# Patient Record
Sex: Female | Born: 1960 | Race: Black or African American | Hispanic: No | Smoking: Never smoker
Health system: Southern US, Community
[De-identification: ages and names within clinical notes are randomized; demographics above are authoritative.]

## PROBLEM LIST (undated history)

## (undated) DIAGNOSIS — I10 Essential (primary) hypertension: Secondary | ICD-10-CM

## (undated) DIAGNOSIS — E669 Obesity, unspecified: Secondary | ICD-10-CM

## (undated) HISTORY — DX: Essential (primary) hypertension: I10

## (undated) HISTORY — PX: TUBAL LIGATION: SHX77

---

## 1998-12-09 ENCOUNTER — Other Ambulatory Visit: Admission: RE | Admit: 1998-12-09 | Discharge: 1998-12-09 | Payer: Self-pay | Admitting: Internal Medicine

## 1999-01-02 ENCOUNTER — Encounter: Payer: Self-pay | Admitting: Internal Medicine

## 1999-01-02 ENCOUNTER — Ambulatory Visit (HOSPITAL_COMMUNITY): Admission: RE | Admit: 1999-01-02 | Discharge: 1999-01-02 | Payer: Self-pay | Admitting: Internal Medicine

## 1999-12-08 ENCOUNTER — Other Ambulatory Visit: Admission: RE | Admit: 1999-12-08 | Discharge: 1999-12-08 | Payer: Self-pay | Admitting: Internal Medicine

## 2000-03-04 ENCOUNTER — Ambulatory Visit (HOSPITAL_COMMUNITY): Admission: RE | Admit: 2000-03-04 | Discharge: 2000-03-04 | Payer: Self-pay | Admitting: Obstetrics and Gynecology

## 2000-03-04 ENCOUNTER — Encounter: Payer: Self-pay | Admitting: Obstetrics and Gynecology

## 2000-04-04 ENCOUNTER — Encounter: Payer: Self-pay | Admitting: Obstetrics and Gynecology

## 2000-04-04 ENCOUNTER — Ambulatory Visit (HOSPITAL_COMMUNITY): Admission: RE | Admit: 2000-04-04 | Discharge: 2000-04-04 | Payer: Self-pay | Admitting: Obstetrics and Gynecology

## 2000-08-15 ENCOUNTER — Inpatient Hospital Stay (HOSPITAL_COMMUNITY): Admission: AD | Admit: 2000-08-15 | Discharge: 2000-08-15 | Payer: Self-pay | Admitting: Obstetrics and Gynecology

## 2000-08-16 ENCOUNTER — Inpatient Hospital Stay (HOSPITAL_COMMUNITY): Admission: AD | Admit: 2000-08-16 | Discharge: 2000-08-19 | Payer: Self-pay | Admitting: Obstetrics and Gynecology

## 2000-08-16 ENCOUNTER — Encounter (INDEPENDENT_AMBULATORY_CARE_PROVIDER_SITE_OTHER): Payer: Self-pay | Admitting: *Deleted

## 2000-11-29 ENCOUNTER — Other Ambulatory Visit: Admission: RE | Admit: 2000-11-29 | Discharge: 2000-11-29 | Payer: Self-pay | Admitting: Obstetrics and Gynecology

## 2000-12-09 ENCOUNTER — Ambulatory Visit (HOSPITAL_COMMUNITY): Admission: RE | Admit: 2000-12-09 | Discharge: 2000-12-09 | Payer: Self-pay | Admitting: Obstetrics and Gynecology

## 2000-12-09 ENCOUNTER — Encounter: Payer: Self-pay | Admitting: Obstetrics and Gynecology

## 2001-03-01 ENCOUNTER — Ambulatory Visit (HOSPITAL_COMMUNITY): Admission: RE | Admit: 2001-03-01 | Discharge: 2001-03-01 | Payer: Self-pay | Admitting: Obstetrics and Gynecology

## 2001-03-01 ENCOUNTER — Encounter: Payer: Self-pay | Admitting: Obstetrics and Gynecology

## 2001-12-11 ENCOUNTER — Encounter: Payer: Self-pay | Admitting: Obstetrics and Gynecology

## 2001-12-11 ENCOUNTER — Ambulatory Visit (HOSPITAL_COMMUNITY): Admission: RE | Admit: 2001-12-11 | Discharge: 2001-12-11 | Payer: Self-pay | Admitting: Obstetrics and Gynecology

## 2002-11-14 ENCOUNTER — Other Ambulatory Visit: Admission: RE | Admit: 2002-11-14 | Discharge: 2002-11-14 | Payer: Self-pay | Admitting: Obstetrics and Gynecology

## 2004-06-10 ENCOUNTER — Other Ambulatory Visit: Admission: RE | Admit: 2004-06-10 | Discharge: 2004-06-10 | Payer: Self-pay | Admitting: Obstetrics and Gynecology

## 2004-06-17 ENCOUNTER — Ambulatory Visit (HOSPITAL_COMMUNITY): Admission: RE | Admit: 2004-06-17 | Discharge: 2004-06-17 | Payer: Self-pay | Admitting: Obstetrics and Gynecology

## 2005-07-27 ENCOUNTER — Ambulatory Visit (HOSPITAL_COMMUNITY): Admission: RE | Admit: 2005-07-27 | Discharge: 2005-07-27 | Payer: Self-pay | Admitting: Internal Medicine

## 2006-08-18 ENCOUNTER — Ambulatory Visit (HOSPITAL_COMMUNITY): Admission: RE | Admit: 2006-08-18 | Discharge: 2006-08-18 | Payer: Self-pay | Admitting: Internal Medicine

## 2007-11-20 ENCOUNTER — Ambulatory Visit (HOSPITAL_COMMUNITY): Admission: RE | Admit: 2007-11-20 | Discharge: 2007-11-20 | Payer: Self-pay | Admitting: Internal Medicine

## 2008-12-11 ENCOUNTER — Ambulatory Visit (HOSPITAL_COMMUNITY): Admission: RE | Admit: 2008-12-11 | Discharge: 2008-12-11 | Payer: Self-pay | Admitting: Internal Medicine

## 2009-09-24 ENCOUNTER — Emergency Department (HOSPITAL_COMMUNITY): Admission: EM | Admit: 2009-09-24 | Discharge: 2009-09-24 | Payer: Self-pay | Admitting: Emergency Medicine

## 2010-06-12 NOTE — Discharge Summary (Signed)
[facility]  Patient:    Danielle Rogers, Danielle Rogers                        MRN: [id number] Adm. Date:  [id number] Disc. Date: [id number] Attending:  Shaune Spittle Dictator:   Vance Gather Duplantis, C.N.M.                           Discharge Summary  NO DICTATION DD:  08/19/00 TD:  08/19/00 Job: [id number]

## 2010-06-12 NOTE — Op Note (Signed)
[facility]  Patient:    Danielle Rogers, Danielle Rogers                        MRN: [id number] Proc. Date: 08/16/00 Adm. Date:  [id number] Attending:  Dierdre Forth [place]                           Operative Report  PREOPERATIVE DIAGNOSES:       1. Intrauterine pregnancy at [redacted] weeks gestation.                               2. Meconium stained amniotic fluid.                               3. Failure to progress in labor.                               4. Nonreassuring fetal heart rate tracing.                               5. Desire for surgical sterilization.  POSTOPERATIVE DIAGNOSES:      1. Intrauterine pregnancy at [redacted] weeks gestation.                               2. Meconium stained amniotic fluid.                               3. Failure to progress in labor.                               4. Nonreassuring fetal heart rate tracing.                               5. Desire for surgical sterilization.                               6. Nuchal and body cord.  OPERATIONS:                   1. Primary low transverse cesarean section.                               2. Bilateral tubal sterilization.  SURGEON:                      Vanessa P. Pennie Rushing, M.D.  ANESTHESIA:                   Epidural.  ESTIMATED BLOOD LOSS:         Less than 750 cc.  COMPLICATIONS:                None.  FINDINGS:                     The patient was delivered of a female infant weighing 7  lb 5 oz with Apgars of 8 and 9 at one and five minutes respectively.  The uterus, tubes and ovaries were normal for the gravid state.   DESCRIPTION OF PROCEDURE:     The patient was taken to the operating room after appropriate identification and placed on the operating table.  After attaining surgical anesthesia with her labor epidural and with her labor Foley in place, the abdomen was prepped with multiple layers of Betadine and draped as a sterile field with the patient in the supine position with a  left lateral tilt.  A transverse incision was made in the abdomen and the abdomen opened in layers.  The peritoneum was entered.  The uterus was then incised 1 cm above the uterovesical fold and the infant delivered from the occiput posterior position.  After having the nares and pharynx DeLee suctioned and the nuchal and body cords reduced, the cord was clamped and cut and the infant was handed off to the awaiting pediatricians.  Appropriate cord blood was drawn.  The placenta was noted to separate from the uterus and was removed from the operative field.  The uterine incision was then closed with a running interlocking suture of 0 Vicryl.  An imbricating suture of 0 Vicryl was placed.  Hemostasis was noted to be adequate.  The left fallopian tube was then identified, followed to its fimbriated end and grasped at the isthmic portion.  A suture of 2-0 chromic was placed through the mesosalpinx and tied fore and aft on a knuckle of tube.  A second ligature was placed proximal to that and the intervening knuckle of tube excised.  The cut ends were cauterized.  A similar procedure was carried out on the right side.  The visceral peritoneum was the uterus was repaired with a figure-of-eight suture of 2-0 Vicryl.  Copious irrigation was carried out and the abdominal peritoneum closed with a running suture of 2-0 Vicryl once hemostasis was noted to be adequate.  The rectus muscles were reapproximated with a figure-of-eight suture of 2-0 Vicryl.  The rectus muscles were irrigated and made hemostatic with Bovie cautery.  The rectus fascia was closed with a running suture of 0 Vicryl then reinforced on either side of midline with a figure-of-eight suture of 0 Vicryl.  The subcutaneous tissue was irrigated and made hemostatic with Bovie cautery.  Skin staples were applied to the skin incision.  A sterile dressing was applied.  The patient was taken from the operating room to the recovery room in  satisfactory condition, having tolerated the procedure well, with sponge and instrument counts correct. DD:  08/16/00 TD:  08/17/00 Job: [id number]

## 2010-06-12 NOTE — Discharge Summary (Signed)
[facility]  Patient:    Danielle Rogers, Danielle Rogers Visit Number: [id number] MRN: [id number]          Service Type: OBS Location: 910A [id number] [id number] Attending Physician:  Shaune Spittle Dictated by:   Vance Gather Duplantis, C.N.M. Adm. Date:  08/16/2000 Disc. Date: 08/19/2000                             Discharge Summary  ADMISSION DIAGNOSES:          1. Intrauterine pregnancy at 41-1/7 weeks.                               2. Early labor.                               3. Positive group B strep.                               4. Multiparity.                               5. Desiring bilateral tubal ligation for                                  sterilization.  DISCHARGE DIAGNOSES:          1. Intrauterine pregnancy at 41-1/7 weeks.                               2. Early labor.                               3. Positive group B strep.                               4. Multiparity.                               5. Desiring bilateral tubal ligation for                                  sterilization.                               6. Nonreassuring fetal heart rate tracing.                               7. Meconium-stained fluid.                               8. Failure to progress in labor.                               9. Status post primary low transverse cesarean  section and bilateral tubal ligation.  PROCEDURES THIS ADMISSION:    1. Primary low transverse cesarean section for                                  delivery of a viable female infant named                                  Danielle Rogers who weighed 7 pounds 5 ounces and had                                  Apgars of 8 and 9 on August 16, 2000 by                                  Dr. Dierdre Forth.                               2. She also had a bilateral tubal ligation done                                  with her cesarean section.  HOSPITAL COURSE:              [name]  is a 50 year old, married black female, gravida 2, para 1-0-0-1, at 41-1/7 weeks who was admitted in early active labor. She progressed to 4-5 cm, 90% and -2 with minimally adequate labor. However, the fetal heart rate started showing intermittent decelerations and did not improve with amnioinfusion and the fetal heart rate was not able to tolerate Pitocin to augment her labor. In light of the circumstances, she was recommended to proceed with a cesarean section which she agreed to do and underwent the same, delivering a viable female infant named Danielle Rogers who weighed 7 pounds 5 ounces and had Apgars of 8 and 9 on July 23,2002 attended by Dr. Dierdre Forth. She also had a bilateral tubal ligation at time of C-section without complication. Please see operative note for details.  Postoperatively, [name] has done well. She is ambulating, voiding, and eating without difficulty. Her vital signs are stable and she has been afebrile throughout her hospital course. She is breast-feeding, also without difficulty. She is deemed ready for discharge August 19, 2000.  DISCHARGE INSTRUCTIONS:       Instructions are as per the [facility] handout.  DISCHARGE MEDICATIONS:        1. Motrin 600 mg p.o. q.6h. p.r.n. for pain.                               2. Tylox one to two p.o. q.4-6h. p.r.n. for                                  pain.                               3. Prenatal vitamins daily.  DISCHARGE LABORATORIES:  Her hemoglobin is 10.0, it was 12.0. Her WBC count was 8.3 and her platelets are 160,000.  DISCHARGE FOLLOWUP:           The patient is to follow up at [facility] in six weeks or p.r.n. Dictated by:   Vance Gather Duplantis, C.N.M.  Attending Physician:  Shaune Spittle DD:  09/15/00 TD:  09/15/00 Job: [id number]

## 2011-03-26 ENCOUNTER — Encounter (HOSPITAL_COMMUNITY): Payer: Self-pay | Admitting: Emergency Medicine

## 2011-03-26 ENCOUNTER — Emergency Department (HOSPITAL_COMMUNITY)
Admission: EM | Admit: 2011-03-26 | Discharge: 2011-03-26 | Disposition: A | Discharge status: Home Health | Payer: Self-pay | Source: Home / Self Care | Attending: Emergency Medicine | Admitting: Emergency Medicine

## 2011-03-26 DIAGNOSIS — M25561 Pain in right knee: Secondary | ICD-10-CM

## 2011-03-26 DIAGNOSIS — M25569 Pain in unspecified knee: Secondary | ICD-10-CM

## 2011-03-26 HISTORY — DX: Obesity, unspecified: E66.9

## 2011-03-26 MED ORDER — HYDROCODONE-ACETAMINOPHEN 5-325 MG PO TABS: 2.0000 | ORAL_TABLET | ORAL | Status: AC | PRN

## 2011-03-26 MED ORDER — NAPROXEN 500 MG PO TABS: 500.0000 mg | ORAL_TABLET | Freq: Two times a day (BID) | ORAL | Status: DC

## 2011-03-26 NOTE — ED Provider Notes (Signed)
History     CSN: [id number]  Arrival date & time 03/26/11  7829   First MD Initiated Contact with Patient 03/26/11 [phone]      Chief Complaint  Patient presents with  . Leg Pain  . Headache    (Consider location/radiation/quality/duration/timing/severity/associated sxs/prior treatment) HPI Comments: Patient reports achy, nonradiating right knee pain located "behind my knee cap" starting last week. Patient states pain is worse with sitting for prolonged periods of time, going up and down hills. No crepitus, redness, swelling, effusion, bruising, distal weakness or paresthesias. No nausea, vomiting, fevers. No recent or remote history of injury to this knee. No history of gout.   ROS as noted in HPI. All other ROS negative.   Patient is a 51 y.o. female presenting with leg pain. The history is provided by the patient. No language interpreter was used.  Leg Pain  The incident occurred more than 1 week ago. There was no injury mechanism. The pain is present in the right knee. The pain has been intermittent since onset. Pertinent negatives include no numbness, no inability to bear weight, no loss of motion, no muscle weakness and no tingling. The symptoms are aggravated by activity. She has tried nothing for the symptoms. The treatment provided no relief.    Past Medical History  Diagnosis Date  . Obesity     Past Surgical History  Procedure Date  . Tubal ligation     History reviewed. No pertinent family history.  History  Substance Use Topics  . Smoking status: Never Smoker   . Smokeless tobacco: Not on file  . Alcohol Use: No    OB History    Grav Para Term Preterm Abortions TAB SAB Ect Mult Living                  Review of Systems  Neurological: Negative for tingling and numbness.    Allergies  Review of patient's allergies indicates no known allergies.  Home Medications   Current Outpatient Rx  Name Route Sig Dispense Refill  . HYDROCODONE-ACETAMINOPHEN  5-325 MG PO TABS Oral Take 2 tablets by mouth every 4 (four) hours as needed for pain. 20 tablet 0  . NAPROXEN 500 MG PO TABS Oral Take 1 tablet (500 mg total) by mouth 2 (two) times daily. 20 tablet 0    BP 123/81  Pulse 66  Temp(Src) 97 F (36.1 C) (Oral)  Resp 17  SpO2 100%  LMP 03/22/2011  Physical Exam  Nursing note and vitals reviewed. Constitutional: She is oriented to person, place, and time. She appears well-developed and well-nourished. No distress.  HENT:  Head: Normocephalic and atraumatic.  Eyes: Conjunctivae and EOM are normal.  Neck: Normal range of motion.  Cardiovascular: Normal rate.   Pulmonary/Chest: Effort normal.  Abdominal: She exhibits no distension.  Musculoskeletal: Normal range of motion.       Mild crepitus. No effusion, redness, increased temperature compared to the knee. Knee ROM baseline for PT, Flexion/extension  Intact, Patella NT, Patellar apprehension test negative, Patellar tendon NT, Medial joint NT, Lateral joint NT, Popliteal region NT, Lachman's stable, Varus stress testing stable, Valgus stress testing stable, McMurray's testing normal , distal NVI with intact baseline sensation / motor / pulse distal to knee. Distal leg, Ankle, foot within normal limits  Neurological: She is alert and oriented to person, place, and time.  Skin: Skin is warm and dry.  Psychiatric: She has a normal mood and affect. Her behavior is normal. Judgment and thought  content normal.    ED Course  Procedures (including critical care time)  Labs Reviewed - No data to display No results found.   1. Right anterior knee pain      MDM  H&P most consistent with patellofemoral syndrome. No knee effusion, no recent or remote history of trauma, knee exam within normal limits. Deferring imaging. Sent home with NSAIDs, relative rest, ice, knee sleeve. Will have her followup with the [facility] if no improvement with this.   Luiz Blare,  MD 03/26/11 [phone]

## 2011-03-26 NOTE — Discharge Instructions (Signed)
RESOURCE GUIDE  Chronic Pain Problems Contact [facility]  [phone] Patients need to be referred by their primary care doctor.  Insufficient Money for Medicine Contact [employer]:  call "211" or [facility] [phone].  No Primary Care Doctor Call [facility]  [phone] Other agencies that provide inexpensive medical care    [facility]  [phone]    [facility]  [phone]    [facility]  [phone]    [facility]  [phone] [address] [place] [address]    [facility]  [phone]    [facility]  [phone] [facility] [phone]   [address] [place] [address] [place], [place] [address]  [facility] Resources  [facility]     [employer]                          [facility] Dept. [address] [place]                       [address]      [address] [place] [address]   [phone] (After Hours)

## 2011-03-26 NOTE — ED Notes (Signed)
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

## 2011-10-31 ENCOUNTER — Emergency Department (HOSPITAL_COMMUNITY)
Admission: EM | Admit: 2011-10-31 | Discharge: 2011-10-31 | Disposition: A | Discharge status: Home / Self Care | Payer: Self-pay | Source: Home / Self Care | Attending: Emergency Medicine | Admitting: Emergency Medicine

## 2011-10-31 ENCOUNTER — Encounter (HOSPITAL_COMMUNITY): Payer: Self-pay

## 2011-10-31 ENCOUNTER — Emergency Department (INDEPENDENT_AMBULATORY_CARE_PROVIDER_SITE_OTHER): Payer: Self-pay

## 2011-10-31 DIAGNOSIS — M25461 Effusion, right knee: Secondary | ICD-10-CM

## 2011-10-31 DIAGNOSIS — M1710 Unilateral primary osteoarthritis, unspecified knee: Secondary | ICD-10-CM

## 2011-10-31 DIAGNOSIS — M25469 Effusion, unspecified knee: Secondary | ICD-10-CM

## 2011-10-31 MED ORDER — IBUPROFEN 800 MG PO TABS: 800.0000 mg | ORAL_TABLET | Freq: Three times a day (TID) | ORAL | Status: DC

## 2011-10-31 MED ORDER — HYDROCODONE-ACETAMINOPHEN 5-500 MG PO TABS: 1.0000 | ORAL_TABLET | Freq: Four times a day (QID) | ORAL | Status: DC | PRN

## 2011-10-31 NOTE — ED Notes (Signed)
Pain states that she feell approx 2 weeks ago and legs have continued to swell , pain is stabbing and throbbing

## 2011-10-31 NOTE — ED Provider Notes (Signed)
History     CSN: [id number]  Arrival date & time 10/31/11  0902   First MD Initiated Contact with Patient 10/31/11 [phone]      Chief Complaint  Patient presents with  . Leg Swelling    (Consider location/radiation/quality/duration/timing/severity/associated sxs/prior treatment) HPI Comments: Patient presents urgent care complaining of right knee pain that exacerbates with walking and and simply putting weight on her right knee. She states that about 2 weeks ago she fell landing in her left knee, interestingly her left knee is not hurting but she does express that she's been hurting from her right knee as she believes she twisted her right knee during the fall. Pain is mainly on the medial aspect of her right knee it feels like a stabbing and throbbing sensation that occasionally wakes her up at night. He has been taking some Aleve over-the-counter with some partial relief but the pain has been getting worse within the last few days. Patient denies any symptoms such as popping sounds or her knee giving out denies any fever chills, but does describe that her knee looks swollen. Patient denies any numbness or tingling or distal weakness of the affected knee.  The history is provided by the patient.    Past Medical History  Diagnosis Date  . Obesity     Past Surgical History  Procedure Date  . Tubal ligation     No family history on file.  History  Substance Use Topics  . Smoking status: Never Smoker   . Smokeless tobacco: Not on file  . Alcohol Use: No    OB History    Grav Para Term Preterm Abortions TAB SAB Ect Mult Living                  Review of Systems  Constitutional: Positive for activity change. Negative for fever, chills, appetite change and fatigue.  Musculoskeletal: Positive for joint swelling.  Skin: Negative for color change, rash and wound.  Neurological: Negative for weakness and numbness.    Allergies  Review of patient's allergies indicates no known  allergies.  Home Medications   Current Outpatient Rx  Name Route Sig Dispense Refill  . HYDROCODONE-ACETAMINOPHEN 5-500 MG PO TABS Oral Take 1 tablet by mouth every 6 (six) hours as needed for pain. 15 tablet 0  . IBUPROFEN 800 MG PO TABS Oral Take 1 tablet (800 mg total) by mouth 3 (three) times daily. 21 tablet 0    BP 119/78  Pulse 80  Temp 99.3 F (37.4 C) (Oral)  Resp 18  SpO2 98%  LMP 08/31/2011  Physical Exam  Nursing note and vitals reviewed. Constitutional: No distress.  Eyes: Conjunctivae normal are normal.  Neck: Neck supple.  Musculoskeletal: She exhibits tenderness.       Right knee: She exhibits decreased range of motion and swelling. She exhibits no ecchymosis, no deformity, no laceration, no erythema, normal alignment, no LCL laxity, normal patellar mobility, no bony tenderness, normal meniscus and no MCL laxity. tenderness found. Medial joint line and MCL tenderness noted. No lateral joint line, no LCL and no patellar tendon tenderness noted.       Legs: Neurological: She is alert.  Skin: No rash noted. She is not diaphoretic. No erythema.    ED Course  Procedures (including critical care time)  Labs Reviewed - No data to display Dg Knee Complete 4 Views Right  10/31/2011  *RADIOLOGY REPORT*  Clinical Data: Right knee pain and soft tissue swelling.  RIGHT KNEE -  COMPLETE 4+ VIEW  Comparison: None.  Findings: No acute fracture or dislocation.  There is mild medial joint space narrowing consistent with osteoarthritis.  Lateral view shows probable suprapatellar joint fluid.  IMPRESSION: No acute fracture.  Probable suprapatellar joint effusion with evidence of osteoarthritis.   Original Report Authenticated By: Reola Calkins, M.D.      1. Arthritis of knee, degenerative   2. Knee effusion, right       MDM  Patient with moderate to severe arthritis and a recent knee injury. Meat felt stable with no increased laxity with meniscal maneuvers. I do suspect  patient might have a medial meniscus or medial collateral ligament sprain strain perhaps a minor tear. Patient also has a mild suprapatellar effusion. I have recommended patient to followup with an orthopedic provider referral was given to her. Have encouraged her to take Motrin every 6-8 hours with fluids and to take Lortab for breakthrough pain management also encouraged her to use the Ace wrap for mild compression. Patient agree with treatment plan and will followup with the orthopedic provider as discussed, for further evaluation and treatment.        Jimmie Molly, MD 10/31/11 1039

## 2011-10-31 NOTE — ED Notes (Signed)
Pt refused knee sleve - given ace wrap instead per request

## 2013-01-10 ENCOUNTER — Encounter (HOSPITAL_COMMUNITY): Payer: Self-pay | Admitting: Emergency Medicine

## 2013-01-10 ENCOUNTER — Emergency Department (INDEPENDENT_AMBULATORY_CARE_PROVIDER_SITE_OTHER): Payer: Self-pay

## 2013-01-10 ENCOUNTER — Emergency Department (INDEPENDENT_AMBULATORY_CARE_PROVIDER_SITE_OTHER)
Admission: EM | Admit: 2013-01-10 | Discharge: 2013-01-10 | Disposition: A | Discharge status: Home / Self Care | Payer: Self-pay | Source: Home / Self Care | Attending: Family Medicine | Admitting: Family Medicine

## 2013-01-10 DIAGNOSIS — M25569 Pain in unspecified knee: Secondary | ICD-10-CM

## 2013-01-10 DIAGNOSIS — M25562 Pain in left knee: Secondary | ICD-10-CM

## 2013-01-10 MED ORDER — HYDROCODONE-ACETAMINOPHEN 5-325 MG PO TABS: 2.0000 | ORAL_TABLET | ORAL | Status: DC | PRN

## 2013-01-10 MED ORDER — MELOXICAM 7.5 MG PO TABS: 7.5000 mg | ORAL_TABLET | Freq: Every day | ORAL | Status: DC

## 2013-01-10 NOTE — ED Notes (Signed)
Reportedly fell 1 year ago, and injured her knee. States she began to have pain again in the same knee last night, no new injury

## 2013-01-10 NOTE — ED Provider Notes (Signed)
CSN: [id number]     Arrival date & time 01/10/13  1556 History   First MD Initiated Contact with Patient 01/10/13 1722     Chief Complaint  Patient presents with  . Knee Pain   (Consider location/radiation/quality/duration/timing/severity/associated sxs/prior Treatment) Patient is a 52 y.o. female presenting with knee pain. The history is provided by the patient. No language interpreter was used.  Knee Pain Location:  Knee Injury: no   Knee location:  L knee Pain details:    Quality:  Aching   Radiates to:  Does not radiate   Severity:  Moderate   Timing:  Constant Chronicity:  New Dislocation: no   Foreign body present:  No foreign bodies Prior injury to area:  Yes Relieved by:  Nothing Worsened by:  Nothing tried   Past Medical History  Diagnosis Date  . Obesity    Past Surgical History  Procedure Laterality Date  . Tubal ligation     History reviewed. No pertinent family history. History  Substance Use Topics  . Smoking status: Never Smoker   . Smokeless tobacco: Not on file  . Alcohol Use: No   OB History   Grav Para Term Preterm Abortions TAB SAB Ect Mult Living                 Review of Systems  Musculoskeletal: Positive for joint swelling.  All other systems reviewed and are negative.    Allergies  Review of patient's allergies indicates no known allergies.  Home Medications   Current Outpatient Rx  Name  Route  Sig  Dispense  Refill  . hydrochlorothiazide (HYDRODIURIL) 12.5 MG tablet   Oral   Take 12.5 mg by mouth daily.         Marland Kitchen HYDROcodone-acetaminophen (LORTAB 5) 5-500 MG per tablet   Oral   Take 1 tablet by mouth every 6 (six) hours as needed for pain.   15 tablet   0   . ibuprofen (ADVIL,MOTRIN) 800 MG tablet   Oral   Take 1 tablet (800 mg total) by mouth 3 (three) times daily.   21 tablet   0    BP 138/72  Pulse 94  Temp(Src) 98.1 F (36.7 C) (Oral)  Resp 16  SpO2 100% Physical Exam  Nursing note and vitals  reviewed. Constitutional: She appears well-developed and well-nourished.  Musculoskeletal: Normal range of motion. She exhibits tenderness.  Swollen left knee,  Tender posterior knee,  From  nv and ns intact  Neurological: She has normal reflexes.  Skin: Skin is warm.  Psychiatric: She has a normal mood and affect.    ED Course  Procedures (including critical care time) Labs Review Labs Reviewed - No data to display Imaging Review Dg Knee Complete 4 Views Left  01/10/2013   CLINICAL DATA:  Pain.  EXAM: LEFT KNEE - COMPLETE 4+ VIEW  COMPARISON:  None.  FINDINGS: There is no evidence of fracture, dislocation, or joint effusion. There is no evidence of arthropathy or other focal bone abnormality. Soft tissues are unremarkable.  IMPRESSION: Negative.   Electronically Signed   By: Maisie Fus  Register   On: 01/10/2013 18:16    EKG Interpretation    Date/Time:    Ventricular Rate:    PR Interval:    QRS Duration:   QT Interval:    QTC Calculation:   R Axis:     Text Interpretation:              MDM  No diagnosis found.  I advised pt I think she needs to seean Orthopaedist for evaluation.   I reviewed xray question of bakers cyst. Pt given rx for meloxicam and hydrocodone.     Lonia Skinner [place], PA-C 01/10/13 [phone]

## 2013-01-11 NOTE — ED Provider Notes (Signed)
Medical screening examination/treatment/procedure(s) were performed by resident physician or non-physician practitioner and as supervising physician I was immediately available for consultation/collaboration.   Abe Schools DOUGLAS MD.   Mckensi Redinger D Amrita Radu, MD 01/11/13 1739 

## 2013-08-09 ENCOUNTER — Other Ambulatory Visit: Payer: Self-pay | Admitting: Obstetrics and Gynecology

## 2013-08-09 DIAGNOSIS — Z1231 Encounter for screening mammogram for malignant neoplasm of breast: Secondary | ICD-10-CM

## 2013-09-18 ENCOUNTER — Ambulatory Visit (HOSPITAL_COMMUNITY): Payer: Self-pay | Attending: Obstetrics and Gynecology

## 2013-09-18 ENCOUNTER — Ambulatory Visit (HOSPITAL_COMMUNITY): Payer: Self-pay

## 2014-03-18 ENCOUNTER — Other Ambulatory Visit: Payer: Self-pay

## 2014-03-18 DIAGNOSIS — Z1231 Encounter for screening mammogram for malignant neoplasm of breast: Secondary | ICD-10-CM

## 2014-03-29 ENCOUNTER — Ambulatory Visit
Admission: RE | Admit: 2014-03-29 | Discharge: 2014-03-29 | Disposition: A | Discharge status: Home / Self Care | Payer: BLUE CROSS/BLUE SHIELD | Source: Ambulatory Visit

## 2014-03-29 DIAGNOSIS — Z1231 Encounter for screening mammogram for malignant neoplasm of breast: Secondary | ICD-10-CM

## 2014-05-03 LAB — HM COLONOSCOPY

## 2015-12-05 ENCOUNTER — Encounter (HOSPITAL_COMMUNITY): Payer: Self-pay | Admitting: Emergency Medicine

## 2015-12-05 ENCOUNTER — Emergency Department (HOSPITAL_COMMUNITY)
Admission: EM | Admit: 2015-12-05 | Discharge: 2015-12-05 | Disposition: A | Discharge status: Home / Self Care | Payer: No Typology Code available for payment source | Attending: Emergency Medicine | Admitting: Emergency Medicine

## 2015-12-05 DIAGNOSIS — S46812A Strain of other muscles, fascia and tendons at shoulder and upper arm level, left arm, initial encounter: Secondary | ICD-10-CM | POA: Diagnosis not present

## 2015-12-05 DIAGNOSIS — M62838 Other muscle spasm: Secondary | ICD-10-CM

## 2015-12-05 DIAGNOSIS — I10 Essential (primary) hypertension: Secondary | ICD-10-CM | POA: Diagnosis not present

## 2015-12-05 DIAGNOSIS — Y92410 Unspecified street and highway as the place of occurrence of the external cause: Secondary | ICD-10-CM | POA: Diagnosis not present

## 2015-12-05 DIAGNOSIS — Y999 Unspecified external cause status: Secondary | ICD-10-CM | POA: Insufficient documentation

## 2015-12-05 DIAGNOSIS — Z791 Long term (current) use of non-steroidal anti-inflammatories (NSAID): Secondary | ICD-10-CM | POA: Diagnosis not present

## 2015-12-05 DIAGNOSIS — S4992XA Unspecified injury of left shoulder and upper arm, initial encounter: Secondary | ICD-10-CM | POA: Diagnosis present

## 2015-12-05 DIAGNOSIS — Y9389 Activity, other specified: Secondary | ICD-10-CM | POA: Insufficient documentation

## 2015-12-05 MED ORDER — NAPROXEN 500 MG PO TABS: 500.0000 mg | ORAL_TABLET | Freq: Two times a day (BID) | ORAL | 0 refills | Status: DC | PRN

## 2015-12-05 MED ORDER — CYCLOBENZAPRINE HCL 10 MG PO TABS: 10.0000 mg | ORAL_TABLET | Freq: Three times a day (TID) | ORAL | 0 refills | Status: DC | PRN

## 2015-12-05 NOTE — Discharge Instructions (Signed)
Take naprosyn as directed for inflammation and pain with tylenol for breakthrough pain and flexeril for muscle relaxation. Do not drive or operate machinery with muscle relaxant use. Use heat to areas of soreness, no more than 20 minutes at a time every hour. Expect to be sore for the next few days and follow up with primary care physician for recheck of ongoing symptoms in the next 1-2 weeks. Return to ER for emergent changing or worsening of symptoms.  °  °
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
## 2016-10-29 ENCOUNTER — Encounter (HOSPITAL_COMMUNITY): Payer: Self-pay | Admitting: *Deleted

## 2016-10-29 ENCOUNTER — Emergency Department (HOSPITAL_COMMUNITY)
Admission: EM | Admit: 2016-10-29 | Discharge: 2016-10-29 | Disposition: A | Discharge status: Home / Self Care | Payer: No Typology Code available for payment source | Attending: Emergency Medicine | Admitting: Emergency Medicine

## 2016-10-29 ENCOUNTER — Emergency Department (HOSPITAL_COMMUNITY): Payer: No Typology Code available for payment source

## 2016-10-29 DIAGNOSIS — Y998 Other external cause status: Secondary | ICD-10-CM | POA: Insufficient documentation

## 2016-10-29 DIAGNOSIS — M25512 Pain in left shoulder: Secondary | ICD-10-CM | POA: Diagnosis present

## 2016-10-29 DIAGNOSIS — R079 Chest pain, unspecified: Secondary | ICD-10-CM | POA: Insufficient documentation

## 2016-10-29 DIAGNOSIS — Z79899 Other long term (current) drug therapy: Secondary | ICD-10-CM | POA: Insufficient documentation

## 2016-10-29 DIAGNOSIS — Y9389 Activity, other specified: Secondary | ICD-10-CM | POA: Diagnosis not present

## 2016-10-29 DIAGNOSIS — Y92410 Unspecified street and highway as the place of occurrence of the external cause: Secondary | ICD-10-CM | POA: Insufficient documentation

## 2016-10-29 MED ORDER — CYCLOBENZAPRINE HCL 10 MG PO TABS: 10.0000 mg | ORAL_TABLET | Freq: Three times a day (TID) | ORAL | 0 refills | Status: DC | PRN

## 2016-10-29 MED ORDER — NAPROXEN 500 MG PO TABS: 500.0000 mg | ORAL_TABLET | Freq: Two times a day (BID) | ORAL | 0 refills | Status: DC | PRN

## 2016-10-29 MED ORDER — ACETAMINOPHEN 500 MG PO TABS
500.0000 mg | ORAL_TABLET | Freq: Four times a day (QID) | ORAL | 0 refills | Status: AC | PRN
Start: 2016-10-29 — End: ?

## 2016-10-29 MED ORDER — IBUPROFEN 400 MG PO TABS
600.0000 mg | ORAL_TABLET | Freq: Once | ORAL | Status: AC | PRN
  Administered 2016-10-29: 600 mg via ORAL
  Filled 2016-10-29: qty 1
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
## 2017-03-16 ENCOUNTER — Other Ambulatory Visit: Payer: Self-pay | Admitting: Internal Medicine

## 2017-03-16 DIAGNOSIS — Z1231 Encounter for screening mammogram for malignant neoplasm of breast: Secondary | ICD-10-CM

## 2017-10-15 ENCOUNTER — Encounter: Payer: Self-pay | Admitting: Nurse Practitioner

## 2017-10-15 DIAGNOSIS — Z6841 Body Mass Index (BMI) 40.0 and over, adult: Secondary | ICD-10-CM

## 2017-10-28 ENCOUNTER — Ambulatory Visit: Payer: Self-pay | Admitting: Internal Medicine

## 2017-10-28 ENCOUNTER — Telehealth: Payer: Self-pay | Admitting: Internal Medicine
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
## 2018-03-02 DIAGNOSIS — J101 Influenza due to other identified influenza virus with other respiratory manifestations: Secondary | ICD-10-CM | POA: Diagnosis not present

## 2018-03-02 DIAGNOSIS — Z79899 Other long term (current) drug therapy: Secondary | ICD-10-CM | POA: Diagnosis not present

## 2018-03-02 DIAGNOSIS — R509 Fever, unspecified: Secondary | ICD-10-CM | POA: Diagnosis present

## 2018-03-03 ENCOUNTER — Emergency Department (HOSPITAL_COMMUNITY)
Admission: EM | Admit: 2018-03-03 | Discharge: 2018-03-03 | Disposition: A | Discharge status: Home / Self Care | Payer: No Typology Code available for payment source | Attending: Emergency Medicine | Admitting: Emergency Medicine

## 2018-03-03 ENCOUNTER — Other Ambulatory Visit: Payer: Self-pay

## 2018-03-03 DIAGNOSIS — J111 Influenza due to unidentified influenza virus with other respiratory manifestations: Secondary | ICD-10-CM

## 2018-03-03 DIAGNOSIS — R69 Illness, unspecified: Secondary | ICD-10-CM

## 2018-03-03 MED ORDER — BENZONATATE 100 MG PO CAPS: 200.0000 mg | ORAL_CAPSULE | Freq: Two times a day (BID) | ORAL | 0 refills | Status: DC | PRN

## 2018-03-03 MED ORDER — MECLIZINE HCL 25 MG PO TABS: 25.0000 mg | ORAL_TABLET | Freq: Two times a day (BID) | ORAL | 0 refills | Status: DC | PRN

## 2018-03-03 NOTE — ED Triage Notes (Signed)
Patient c/o cough, chills, sore throat, dizziness and diarrhea x3 days.

## 2018-03-03 NOTE — ED Provider Notes (Addendum)
[facility]   CSN: [id number] Arrival date & time: 03/02/18  2352     History   Chief Complaint Chief Complaint  Patient presents with  . URI    HPI Elson ClanRachel Rogers is a 58 y.o. female.  Patient presents to the emergency department with a chief complaint of fevers, chills, cough, sore throat, and some dizziness.  Reports onset of symptoms 3 days ago.  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  Also reports having a couple of episodes of diarrhea.  Has tried OTC medications with some relief.  Denies any other associated symptoms.  There are no aggravating factors.  The history is provided by the patient. No language interpreter was used.    Past Medical History:  Diagnosis Date  . Obesity     Patient Active Problem List   Diagnosis Date Noted  . Morbid obesity due to excess calories (HCC) 10/15/2017  . Body mass index (BMI) 40.0-44.9, adult (HCC) 10/15/2017    Past Surgical History:  Procedure Laterality Date  . TUBAL LIGATION       OB History   No obstetric history on file.      Home Medications    Prior to Admission medications   Medication Sig Start Date End Date Taking? Authorizing Provider  acetaminophen (TYLENOL) 500 MG tablet Take 1 tablet (500 mg total) by mouth every 6 (six) hours as needed. 10/29/16   Law, Waylan BogaAlexandra M, PA-C  benzonatate (TESSALON) 100 MG capsule Take 2 capsules (200 mg total) by mouth 2 (two) times daily as needed for cough. 03/03/18   Roxy HorsemanBrowning, Leonardo Makris, PA-C  cyclobenzaprine (FLEXERIL) 10 MG tablet Take 1 tablet (10 mg total) by mouth 3 (three) times daily as needed for muscle spasms. 10/29/16   Law, Waylan BogaAlexandra M, PA-C  hydrochlorothiazide (HYDRODIURIL) 12.5 MG tablet Take 12.5 mg by mouth daily.    [provider]  HYDROcodone-acetaminophen (LORTAB 5) 5-500 MG per tablet Take 1 tablet by mouth every 6 (six) hours as needed for pain. 10/31/11    Jimmie Mollyoll, Paolo, MD  HYDROcodone-acetaminophen (NORCO/VICODIN) 5-325 MG per tablet Take 2 tablets by mouth every 4 (four) hours as needed for moderate pain. 01/10/13   Elson AreasSofia, Leslie K, PA-C  ibuprofen (ADVIL,MOTRIN) 800 MG tablet Take 1 tablet (800 mg total) by mouth 3 (three) times daily. 10/31/11   Jimmie Mollyoll, Paolo, MD  meclizine (ANTIVERT) 25 MG tablet Take 1 tablet (25 mg total) by mouth 2 (two) times daily as needed for dizziness. 03/03/18   Roxy HorsemanBrowning, Earley Grobe, PA-C  meloxicam (MOBIC) 7.5 MG tablet Take 1 tablet (7.5 mg total) by mouth daily. 01/10/13   Elson AreasSofia, Leslie K, PA-C  naproxen (NAPROSYN) 500 MG tablet Take 1 tablet (500 mg total) by mouth 2 (two) times daily as needed for mild pain, moderate pain or headache (TAKE WITH MEALS.). 10/29/16   Law, Waylan BogaAlexandra M, PA-C  phentermine 15 MG capsule Take 15 mg by mouth every morning.    [provider]    Family History No family history on file.  Social History Social History   Tobacco Use  . Smoking status: Never Smoker  Substance Use Topics  . Alcohol use: No  . Drug use: No     Allergies   Patient has no known allergies.   Review of Systems Review of Systems  Constitutional: Positive for chills and fever.  HENT: Positive for postnasal drip, rhinorrhea, sinus pressure, sneezing  and sore throat.   Respiratory: Positive for cough. Negative for shortness of breath.   Cardiovascular: Negative for chest pain.  Gastrointestinal: Negative for abdominal pain, constipation, diarrhea, nausea and vomiting.  Genitourinary: Negative for dysuria.     Physical Exam Updated Vital Signs BP 118/78 (BP Location: Right Arm)   Pulse 92   Temp 99.2 F (37.3 C) (Oral)   Resp 17   Ht 5\' 4"  (1.626 m)   Wt 86.2 kg   LMP 08/31/2011   SpO2 97%   BMI 32.61 kg/m   Physical Exam Nursing note and vitals reviewed.  Constitutional: Appears well-developed and well-nourished. No distress.  HENT: Oropharynx is mildly erythematous, no tonsillar  exudates or abscess, airway intact. Eyes: Conjunctivae are normal. Pupils are equal, round, and reactive to light. Horizontal nystagmus Neck: Normal range of motion and full passive range of motion without pain.  Cardiovascular: normal rate and intact distal pulses.   Pulmonary/Chest: Effort normal and breath sounds normal. No stridor. No wheezes, rhonchi, or rales. Abdominal: Soft. There is no focal abdominal tenderness.  Musculoskeletal: Normal range of motion. Moves all extremities. Neurological: Pt is alert and oriented to person, place, and time. Sensation and strength grossly intact throughout. Skin: Skin is warm and dry. No rash noted. Pt is not diaphoretic.  Psychiatric: Normal mood and affect.    ED Treatments / Results  Labs (all labs ordered are listed, but only abnormal results are displayed) Labs Reviewed - No data to display  EKG None  Radiology No results found.  Procedures Procedures (including critical care time)  Medications Ordered in ED Medications - No data to display   Initial Impression / Assessment and Plan / ED Course  I have reviewed the triage vital signs and the nursing notes.  Pertinent labs & imaging results that were available during my care of the patient were reviewed by me and considered in my medical decision making (see chart for details).     Patient with symptoms consistent with influenza.  Vitals are stable, low-grade fever.  No signs of dehydration, tolerating PO's.  Lungs are clear. Due to patient's presentation and physical exam a chest x-ray was not ordered bc likely diagnosis of flu.  Discussed that symptoms are greater than the recommended 24-48 hour window of treatment.  Patient will be discharged with instructions to orally hydrate, rest, and use over-the-counter medications such as anti-inflammatories ibuprofen and Aleve for muscle aches and Tylenol for fever.  Patient will also be given a cough suppressant.   She does have some  vertiginous type dizziness as well.  Worse with head movement.  + horizontal nystagmus.  Better at rest and with closed eyes.  Doubt central process.  Will given meclizine. Final Clinical Impressions(s) / ED Diagnoses   Final diagnoses:  Influenza-like illness    ED Discharge Orders         Ordered    meclizine (ANTIVERT) 25 MG tablet  2 times daily PRN     03/03/18 0136    benzonatate (TESSALON) 100 MG capsule  2 times daily PRN     03/03/18 0136           Roxy Horseman, PA-C 03/03/18 0315    Roxy Horseman, PA-C 03/03/18 4388    Shaune Pollack, MD 03/03/18 2321

## 2018-03-06 ENCOUNTER — Encounter (HOSPITAL_COMMUNITY): Payer: Self-pay | Admitting: Emergency Medicine

## 2018-03-06 ENCOUNTER — Ambulatory Visit (HOSPITAL_COMMUNITY)
Admission: EM | Admit: 2018-03-06 | Discharge: 2018-03-06 | Disposition: A | Discharge status: Home / Self Care | Payer: No Typology Code available for payment source | Attending: Family Medicine | Admitting: Family Medicine

## 2018-03-06 DIAGNOSIS — B9789 Other viral agents as the cause of diseases classified elsewhere: Secondary | ICD-10-CM

## 2018-03-06 DIAGNOSIS — J069 Acute upper respiratory infection, unspecified: Secondary | ICD-10-CM

## 2018-03-06 MED ORDER — ONDANSETRON 4 MG PO TBDP: 4.0000 mg | ORAL_TABLET | Freq: Three times a day (TID) | ORAL | 0 refills | Status: DC | PRN

## 2018-03-06 NOTE — ED Triage Notes (Signed)
Pt states on Wednesday started sore throat vomiting, diarrhea, gave her some medicine which didn't help the dizziness, pt states she keeps vomiting.

## 2018-03-06 NOTE — ED Provider Notes (Signed)
[facility]    CSN: [id number] Arrival date & time: 03/06/18  0907     History   Chief Complaint Chief Complaint  Patient presents with  . Cough  . Dizziness    HPI Danielle Rogers is a 58 y.o. female.   Patient has several complaints.  Dizziness cough nausea vomiting diarrhea.  She was seen in the emergency room 2 days ago and given some Tessalon and Antivert for dizziness.  Dizziness is described as vertigo and is worse with changes in position and movement of her head.  HPI  Past Medical History:  Diagnosis Date  . Obesity     Patient Active Problem List   Diagnosis Date Noted  . Morbid obesity due to excess calories (HCC) 10/15/2017  . Body mass index (BMI) 40.0-44.9, adult (HCC) 10/15/2017    Past Surgical History:  Procedure Laterality Date  . TUBAL LIGATION      OB History   No obstetric history on file.      Home Medications    Prior to Admission medications   Medication Sig Start Date End Date Taking? Authorizing Provider  acetaminophen (TYLENOL) 500 MG tablet Take 1 tablet (500 mg total) by mouth every 6 (six) hours as needed. Patient not taking: Reported on 03/06/2018 10/29/16   Emi Holes, PA-C  benzonatate (TESSALON) 100 MG capsule Take 2 capsules (200 mg total) by mouth 2 (two) times daily as needed for cough. 03/03/18   Roxy Horseman, PA-C  cyclobenzaprine (FLEXERIL) 10 MG tablet Take 1 tablet (10 mg total) by mouth 3 (three) times daily as needed for muscle spasms. Patient not taking: Reported on 03/06/2018 10/29/16   Emi Holes, PA-C  hydrochlorothiazide (HYDRODIURIL) 12.5 MG tablet Take 12.5 mg by mouth daily.    [provider]  HYDROcodone-acetaminophen (LORTAB 5) 5-500 MG per tablet Take 1 tablet by mouth every 6 (six) hours as needed for pain. Patient not taking: Reported on 03/06/2018 10/31/11   Jimmie Molly, MD  HYDROcodone-acetaminophen (NORCO/VICODIN) 5-325 MG per tablet Take 2 tablets by mouth every 4 (four)  hours as needed for moderate pain. Patient not taking: Reported on 03/06/2018 01/10/13   Elson Areas, PA-C  ibuprofen (ADVIL,MOTRIN) 800 MG tablet Take 1 tablet (800 mg total) by mouth 3 (three) times daily. Patient not taking: Reported on 03/06/2018 10/31/11   Jimmie Molly, MD  meclizine (ANTIVERT) 25 MG tablet Take 1 tablet (25 mg total) by mouth 2 (two) times daily as needed for dizziness. 03/03/18   Roxy Horseman, PA-C  meloxicam (MOBIC) 7.5 MG tablet Take 1 tablet (7.5 mg total) by mouth daily. Patient not taking: Reported on 03/06/2018 01/10/13   Elson Areas, PA-C  naproxen (NAPROSYN) 500 MG tablet Take 1 tablet (500 mg total) by mouth 2 (two) times daily as needed for mild pain, moderate pain or headache (TAKE WITH MEALS.). Patient not taking: Reported on 03/06/2018 10/29/16   Emi Holes, PA-C  phentermine 15 MG capsule Take 15 mg by mouth every morning.    [provider]    Family History No family history on file.  Social History Social History   Tobacco Use  . Smoking status: Never Smoker  Substance Use Topics  . Alcohol use: No  . Drug use: No     Allergies   Patient has no known allergies.   Review of Systems Review of Systems  Constitutional: Negative.   HENT: Positive for sore throat.   Respiratory: Positive for cough.  Gastrointestinal: Positive for diarrhea, nausea and vomiting.  Neurological: Positive for dizziness.  All other systems reviewed and are negative.    Physical Exam Triage Vital Signs ED Triage Vitals  Enc Vitals Group     BP 03/06/18 1034 (!) 145/73     Pulse Rate 03/06/18 1033 68     Resp 03/06/18 1033 18     Temp 03/06/18 1033 97.8 F (36.6 C)     Temp src --      SpO2 03/06/18 1033 100 %     Weight --      Height --      Head Circumference --      Peak Flow --      Pain Score 03/06/18 1035 0     Pain Loc --      Pain Edu? --      Excl. in GC? --    No data found.  Updated Vital Signs BP (!) 145/73    Pulse 68   Temp 97.8 F (36.6 C)   Resp 18   LMP 08/31/2011   SpO2 100%   Visual Acuity Right Eye Distance:   Left Eye Distance:   Bilateral Distance:    Right Eye Near:   Left Eye Near:    Bilateral Near:     Physical Exam Constitutional:      Appearance: Normal appearance. She is obese.  HENT:     Head: Normocephalic.     Right Ear: Tympanic membrane normal.     Left Ear: Tympanic membrane normal.     Mouth/Throat:     Mouth: Mucous membranes are moist.     Pharynx: Oropharynx is clear.  Cardiovascular:     Rate and Rhythm: Normal rate and regular rhythm.  Pulmonary:     Effort: Pulmonary effort is normal.     Breath sounds: Normal breath sounds.  Abdominal:     General: Bowel sounds are normal.     Palpations: Abdomen is soft.  Neurological:     General: No focal deficit present.     Mental Status: She is alert and oriented to person, place, and time.      UC Treatments / Results  Labs (all labs ordered are listed, but only abnormal results are displayed) Labs Reviewed - No data to display  EKG None  Radiology No results found.  Procedures Procedures (including critical care time)  Medications Ordered in UC Medications - No data to display  Initial Impression / Assessment and Plan / UC Course  I have reviewed the triage vital signs and the nursing notes.  Pertinent labs & imaging results that were available during my care of the patient were reviewed by me and considered in my medical decision making (see chart for details).     Dizziness, probably inner ear infection.  Will continue Antivert but give her Epley exercises to do and Zofran for the nausea and vomiting.  Recommend Imodium for diarrhea Final Clinical Impressions(s) / UC Diagnoses   Final diagnoses:  None   Discharge Instructions   None    ED Prescriptions    None     Controlled Substance Prescriptions [place] Controlled Substance Registry consulted? No   Frederica Kuster,  MD 03/06/18 1112

## 2018-03-06 NOTE — Discharge Instructions (Addendum)
Imodium for diarrhea Epley exercises for dizziness

## 2018-03-17 ENCOUNTER — Ambulatory Visit: Payer: No Typology Code available for payment source | Admitting: Nurse Practitioner

## 2018-03-17 ENCOUNTER — Other Ambulatory Visit (HOSPITAL_COMMUNITY)
Admission: RE | Admit: 2018-03-17 | Discharge: 2018-03-17 | Disposition: A | Discharge status: Home / Self Care | Payer: No Typology Code available for payment source | Source: Ambulatory Visit | Attending: Nurse Practitioner | Admitting: Nurse Practitioner

## 2018-03-17 ENCOUNTER — Other Ambulatory Visit: Payer: Self-pay

## 2018-03-17 ENCOUNTER — Encounter: Payer: Self-pay | Admitting: Nurse Practitioner

## 2018-03-17 VITALS — BP 140/86 | HR 96 | Temp 97.8°F | Ht 64.2 in | Wt 236.0 lb

## 2018-03-17 DIAGNOSIS — Z1159 Encounter for screening for other viral diseases: Secondary | ICD-10-CM | POA: Diagnosis not present

## 2018-03-17 DIAGNOSIS — Z6841 Body Mass Index (BMI) 40.0 and over, adult: Secondary | ICD-10-CM

## 2018-03-17 DIAGNOSIS — Z23 Encounter for immunization: Secondary | ICD-10-CM | POA: Diagnosis not present

## 2018-03-17 DIAGNOSIS — N898 Other specified noninflammatory disorders of vagina: Secondary | ICD-10-CM

## 2018-03-17 DIAGNOSIS — Z0000 Encounter for general adult medical examination without abnormal findings: Secondary | ICD-10-CM | POA: Diagnosis not present

## 2018-03-17 DIAGNOSIS — R030 Elevated blood-pressure reading, without diagnosis of hypertension: Secondary | ICD-10-CM | POA: Insufficient documentation

## 2018-03-17 DIAGNOSIS — Z1231 Encounter for screening mammogram for malignant neoplasm of breast: Secondary | ICD-10-CM | POA: Diagnosis not present

## 2018-03-17 DIAGNOSIS — Z113 Encounter for screening for infections with a predominantly sexual mode of transmission: Secondary | ICD-10-CM | POA: Insufficient documentation

## 2018-03-17 DIAGNOSIS — I10 Essential (primary) hypertension: Secondary | ICD-10-CM

## 2018-03-17 LAB — POCT UA - MICROALBUMIN
Albumin/Creatinine Ratio, Urine, POC: <30
Creatinine, POC: 200 mg/dL
Microalbumin Ur, POC: 10 mg/L

## 2018-03-17 LAB — POCT URINALYSIS DIPSTICK
BILIRUBIN UA: NEGATIVE
Glucose, UA: NEGATIVE
Ketones, UA: NEGATIVE
Nitrite, UA: NEGATIVE
Protein, UA: NEGATIVE
Spec Grav, UA: 1.025 (ref 1.010–1.025)
Urobilinogen, UA: 1 E.U./dL
pH, UA: 6 (ref 5.0–8.0)

## 2018-03-17 MED ORDER — HYDROCHLOROTHIAZIDE 12.5 MG PO TABS: 12.5000 mg | ORAL_TABLET | Freq: Every day | ORAL | 0 refills | Status: DC

## 2018-03-17 MED ORDER — TETANUS-DIPHTH-ACELL PERTUSSIS 5-2.5-18.5 LF-MCG/0.5 IM SUSP
0.5000 mL | Freq: Once | INTRAMUSCULAR | Status: AC
  Administered 2018-03-17: 0.5 mL via INTRAMUSCULAR

## 2018-03-17 NOTE — Patient Instructions (Addendum)
Health Maintenance, Female Adopting a healthy lifestyle and getting preventive care can go a long way to promote health and wellness. Talk with your health care provider about what schedule of regular examinations is right for you. This is a good chance for you to check in with your provider about disease prevention and staying healthy. In between checkups, there are plenty of things you can do on your own. Experts have done a lot of research about which lifestyle changes and preventive measures are most likely to keep you healthy. Ask your health care provider for more information. Weight and diet Eat a healthy diet  Be sure to include plenty of vegetables, fruits, low-fat dairy products, and lean protein.  Do not eat a lot of foods high in solid fats, added sugars, or salt.  Get regular exercise. This is one of the most important things you can do for your health. ? Most adults should exercise for at least 150 minutes each week. The exercise should increase your heart rate and make you sweat (moderate-intensity exercise). ? Most adults should also do strengthening exercises at least twice a week. This is in addition to the moderate-intensity exercise. Maintain a healthy weight  Body mass index (BMI) is a measurement that can be used to identify possible weight problems. It estimates body fat based on height and weight. Your health care provider can help determine your BMI and help you achieve or maintain a healthy weight.  For females 20 years of age and older: ? A BMI below 18.5 is considered underweight. ? A BMI of 18.5 to 24.9 is normal. ? A BMI of 25 to 29.9 is considered overweight. ? A BMI of 30 and above is considered obese. Watch levels of cholesterol and blood lipids  You should start having your blood tested for lipids and cholesterol at 60 years of age, then have this test every 5 years.  You may need to have your cholesterol levels checked more often if: ? Your lipid or  cholesterol levels are high. ? You are older than 60 years of age. ? You are at high risk for heart disease. Cancer screening Lung Cancer  Lung cancer screening is recommended for adults 55-80 years old who are at high risk for lung cancer because of a history of smoking.  A yearly low-dose CT scan of the lungs is recommended for people who: ? Currently smoke. ? Have quit within the past 15 years. ? Have at least a 30-pack-year history of smoking. A pack year is smoking an average of one pack of cigarettes a day for 1 year.  Yearly screening should continue until it has been 15 years since you quit.  Yearly screening should stop if you develop a health problem that would prevent you from having lung cancer treatment. Breast Cancer  Practice breast self-awareness. This means understanding how your breasts normally appear and feel.  It also means doing regular breast self-exams. Let your health care provider know about any changes, no matter how small.  If you are in your 20s or 30s, you should have a clinical breast exam (CBE) by a health care provider every 1-3 years as part of a regular health exam.  If you are 40 or older, have a CBE every year. Also consider having a breast X-ray (mammogram) every year.  If you have a family history of breast cancer, talk to your health care provider about genetic screening.  If you are at high risk for breast cancer, talk   to your health care provider about having an MRI and a mammogram every year.  Breast cancer gene (BRCA) assessment is recommended for women who have family members with BRCA-related cancers. BRCA-related cancers include: ? Breast. ? Ovarian. ? Tubal. ? Peritoneal cancers.  Results of the assessment will determine the need for genetic counseling and BRCA1 and BRCA2 testing. Cervical Cancer Your health care provider may recommend that you be screened regularly for cancer of the pelvic organs (ovaries, uterus, and vagina).  This screening involves a pelvic examination, including checking for microscopic changes to the surface of your cervix (Pap test). You may be encouraged to have this screening done every 3 years, beginning at age 21.  For women ages 30-65, health care providers may recommend pelvic exams and Pap testing every 3 years, or they may recommend the Pap and pelvic exam, combined with testing for human papilloma virus (HPV), every 5 years. Some types of HPV increase your risk of cervical cancer. Testing for HPV may also be done on women of any age with unclear Pap test results.  Other health care providers may not recommend any screening for nonpregnant women who are considered low risk for pelvic cancer and who do not have symptoms. Ask your health care provider if a screening pelvic exam is right for you.  If you have had past treatment for cervical cancer or a condition that could lead to cancer, you need Pap tests and screening for cancer for at least 20 years after your treatment. If Pap tests have been discontinued, your risk factors (such as having a new sexual partner) need to be reassessed to determine if screening should resume. Some women have medical problems that increase the chance of getting cervical cancer. In these cases, your health care provider may recommend more frequent screening and Pap tests. Colorectal Cancer  This type of cancer can be detected and often prevented.  Routine colorectal cancer screening usually begins at 60 years of age and continues through 60 years of age.  Your health care provider may recommend screening at an earlier age if you have risk factors for colon cancer.  Your health care provider may also recommend using home test kits to check for hidden blood in the stool.  A small camera at the end of a tube can be used to examine your colon directly (sigmoidoscopy or colonoscopy). This is done to check for the earliest forms of colorectal cancer.  Routine  screening usually begins at age 50.  Direct examination of the colon should be repeated every 5-10 years through 60 years of age. However, you may need to be screened more often if early forms of precancerous polyps or small growths are found. Skin Cancer  Check your skin from head to toe regularly.  Tell your health care provider about any new moles or changes in moles, especially if there is a change in a mole's shape or color.  Also tell your health care provider if you have a mole that is larger than the size of a pencil eraser.  Always use sunscreen. Apply sunscreen liberally and repeatedly throughout the day.  Protect yourself by wearing long sleeves, pants, a wide-brimmed hat, and sunglasses whenever you are outside. Heart disease, diabetes, and high blood pressure  High blood pressure causes heart disease and increases the risk of stroke. High blood pressure is more likely to develop in: ? People who have blood pressure in the high end of the normal range (130-139/85-89 mm Hg). ? People   who are overweight or obese. ? People who are African American.  If you are 84-22 years of age, have your blood pressure checked every 3-5 years. If you are 67 years of age or older, have your blood pressure checked every year. You should have your blood pressure measured twice-once when you are at a hospital or clinic, and once when you are not at a hospital or clinic. Record the average of the two measurements. To check your blood pressure when you are not at a hospital or clinic, you can use: ? An automated blood pressure machine at a pharmacy. ? A home blood pressure monitor.  If you are between 52 years and 3 years old, ask your health care provider if you should take aspirin to prevent strokes.  Have regular diabetes screenings. This involves taking a blood sample to check your fasting blood sugar level. ? If you are at a normal weight and have a low risk for diabetes, have this test once  every three years after 60 years of age. ? If you are overweight and have a high risk for diabetes, consider being tested at a younger age or more often. Preventing infection Hepatitis B  If you have a higher risk for hepatitis B, you should be screened for this virus. You are considered at high risk for hepatitis B if: ? You were born in a country where hepatitis B is common. Ask your health care provider which countries are considered high risk. ? Your parents were born in a high-risk country, and you have not been immunized against hepatitis B (hepatitis B vaccine). ? You have HIV or AIDS. ? You use needles to inject street drugs. ? You live with someone who has hepatitis B. ? You have had sex with someone who has hepatitis B. ? You get hemodialysis treatment. ? You take certain medicines for conditions, including cancer, organ transplantation, and autoimmune conditions. Hepatitis C  Blood testing is recommended for: ? Everyone born from [birth year] through 1967. ? Anyone with known risk factors for hepatitis C. Sexually transmitted infections (STIs)  You should be screened for sexually transmitted infections (STIs) including gonorrhea and chlamydia if: ? You are sexually active and are younger than 60 years of age. ? You are older than 60 years of age and your health care provider tells you that you are at risk for this type of infection. ? Your sexual activity has changed since you were last screened and you are at an increased risk for chlamydia or gonorrhea. Ask your health care provider if you are at risk.  If you do not have HIV, but are at risk, it may be recommended that you take a prescription medicine daily to prevent HIV infection. This is called pre-exposure prophylaxis (PrEP). You are considered at risk if: ? You are sexually active and do not regularly use condoms or know the HIV status of your partner(s). ? You take drugs by injection. ? You are sexually active with a partner  who has HIV. Talk with your health care provider about whether you are at high risk of being infected with HIV. If you choose to begin PrEP, you should first be tested for HIV. You should then be tested every 3 months for as long as you are taking PrEP. Pregnancy  If you are premenopausal and you may become pregnant, ask your health care provider about preconception counseling.  If you may become pregnant, take 400 to 800 micrograms (mcg) of folic acid every  day.  If you want to prevent pregnancy, talk to your health care provider about birth control (contraception). Osteoporosis and menopause  Osteoporosis is a disease in which the bones lose minerals and strength with aging. This can result in serious bone fractures. Your risk for osteoporosis can be identified using a bone density scan.  If you are 7 years of age or older, or if you are at risk for osteoporosis and fractures, ask your health care provider if you should be screened.  Ask your health care provider whether you should take a calcium or vitamin D supplement to lower your risk for osteoporosis.  Menopause may have certain physical symptoms and risks.  Hormone replacement therapy may reduce some of these symptoms and risks. Talk to your health care provider about whether hormone replacement therapy is right for you. Follow these instructions at home:  Schedule regular health, dental, and eye exams.  Stay current with your immunizations.  Do not use any tobacco products including cigarettes, chewing tobacco, or electronic cigarettes.  If you are pregnant, do not drink alcohol.  If you are breastfeeding, limit how much and how often you drink alcohol.  Limit alcohol intake to no more than 1 drink per day for nonpregnant women. One drink equals 12 ounces of beer, 5 ounces of wine, or 1 ounces of hard liquor.  Do not use street drugs.  Do not share needles.  Ask your health care provider for help if you need support  or information about quitting drugs.  Tell your health care provider if you often feel depressed.  Tell your health care provider if you have ever been abused or do not feel safe at home. This information is not intended to replace advice given to you by your health care provider. Make sure you discuss any questions you have with your health care provider. Document Released: 07/27/2010 Document Revised: 06/19/2015 Document Reviewed: 10/15/2014 Elsevier Interactive Patient Education  2019 [place] an over the Pharmacist, community or claritin daily to see if helps with dizziness.  Return to office in 8 weeks for recheck blood pressure and weight check, goal to lose 4-8 lbs in next 2 months.  Small goals to exercise

## 2018-03-17 NOTE — Progress Notes (Signed)
Subjective:     Patient ID: Danielle Rogers , female    DOB: 30-Oct-1960 , 58 y.o.   MRN: [id number]   Chief Complaint  Patient presents with  . Annual Exam   The patient states she uses post menopausal status for birth control. Last LMP was Patient's last menstrual period was 08/31/2011.. Negative for Dysmenorrhea and Negative for Menorrhagia Mammogram last done 2016.  Negative for: breast discharge, breast lump(s), breast pain and breast self exam.  Pertinent negatives include abnormal bleeding (hematology), anxiety, decreased libido, depression, difficulty falling sleep, dyspareunia, history of infertility, nocturia, sexual dysfunction, sleep disturbances, urinary incontinence, urinary urgency, vaginal discharge and vaginal itching. Diet regular, eats toast and orange juice in the morning, will not eat lunch as she should.  Grab a granola and then eat a salad. She works early in the morning.  The patient states her exercise level is   minimal.      The patient's tobacco use is:  Social History   Tobacco Use  Smoking Status Never Smoker  Smokeless Tobacco Never Used   She has been exposed to passive smoke. The patient's alcohol use is:  Social History   Substance and Sexual Activity  Alcohol Use No   Additional information: Last pap 2019  next one scheduled for 2022 .   HPI  HPI   Past Medical History:  Diagnosis Date  . Hypertension   . Obesity      No family history on file.   Current Outpatient Medications:  .  acetaminophen (TYLENOL) 500 MG tablet, Take 1 tablet (500 mg total) by mouth every 6 (six) hours as needed., Disp: 30 tablet, Rfl: 0 .  ibuprofen (ADVIL,MOTRIN) 800 MG tablet, Take 1 tablet (800 mg total) by mouth 3 (three) times daily., Disp: 21 tablet, Rfl: 0 .  meclizine (ANTIVERT) 25 MG tablet, Take 1 tablet (25 mg total) by mouth 2 (two) times daily as needed for dizziness., Disp: 15 tablet, Rfl: 0 .  phentermine 15 MG capsule, Take 15 mg by mouth every  morning., Disp: , Rfl:  .  benzonatate (TESSALON) 100 MG capsule, Take 2 capsules (200 mg total) by mouth 2 (two) times daily as needed for cough. (Patient not taking: Reported on 03/17/2018), Disp: 20 capsule, Rfl: 0 .  cyclobenzaprine (FLEXERIL) 10 MG tablet, Take 1 tablet (10 mg total) by mouth 3 (three) times daily as needed for muscle spasms. (Patient not taking: Reported on 03/06/2018), Disp: 15 tablet, Rfl: 0 .  hydrochlorothiazide (HYDRODIURIL) 12.5 MG tablet, Take 12.5 mg by mouth daily., Disp: , Rfl:  .  HYDROcodone-acetaminophen (LORTAB 5) 5-500 MG per tablet, Take 1 tablet by mouth every 6 (six) hours as needed for pain. (Patient not taking: Reported on 03/06/2018), Disp: 15 tablet, Rfl: 0 .  HYDROcodone-acetaminophen (NORCO/VICODIN) 5-325 MG per tablet, Take 2 tablets by mouth every 4 (four) hours as needed for moderate pain. (Patient not taking: Reported on 03/06/2018), Disp: 20 tablet, Rfl: 0 .  meloxicam (MOBIC) 7.5 MG tablet, Take 1 tablet (7.5 mg total) by mouth daily. (Patient not taking: Reported on 03/06/2018), Disp: 20 tablet, Rfl: 0 .  naproxen (NAPROSYN) 500 MG tablet, Take 1 tablet (500 mg total) by mouth 2 (two) times daily as needed for mild pain, moderate pain or headache (TAKE WITH MEALS.). (Patient not taking: Reported on 03/06/2018), Disp: 20 tablet, Rfl: 0 .  ondansetron (ZOFRAN ODT) 4 MG disintegrating tablet, Take 1 tablet (4 mg total) by mouth every 8 (eight) hours as needed  for nausea or vomiting. (Patient not taking: Reported on 03/17/2018), Disp: 10 tablet, Rfl: 0   No Known Allergies   Review of Systems  Constitutional: Negative for fatigue and fever.  Respiratory: Negative for cough.   Cardiovascular: Negative.   Endocrine: Negative for polydipsia, polyphagia and polyuria.  Musculoskeletal: Negative.      Today's Vitals   03/17/18 1001  BP: 140/86  Pulse: 96  Temp: 97.8 F (36.6 C)  TempSrc: Oral  SpO2: 98%  Weight: 236 lb (107 kg)  Height: 5' 4.2" (1.631  m)   Body mass index is 40.26 kg/m.   Objective:  Physical Exam Vitals signs reviewed.  Constitutional:      Appearance: Normal appearance. She is well-developed.  HENT:     Head: Normocephalic and atraumatic.     Right Ear: Hearing, tympanic membrane, ear canal and external ear normal.     Left Ear: Hearing, tympanic membrane, ear canal and external ear normal.     Nose: Nose normal.     Mouth/Throat:     Mouth: Mucous membranes are moist.  Eyes:     General: Lids are normal.     Conjunctiva/sclera: Conjunctivae normal.     Pupils: Pupils are equal, round, and reactive to light.     Funduscopic exam:    Right eye: No papilledema.        Left eye: No papilledema.  Neck:     Musculoskeletal: Full passive range of motion without pain, normal range of motion and neck supple.     Thyroid: No thyroid mass.     Vascular: No carotid bruit.  Cardiovascular:     Rate and Rhythm: Normal rate and regular rhythm.     Pulses: Normal pulses.     Heart sounds: Normal heart sounds. No murmur.  Pulmonary:     Effort: Pulmonary effort is normal. No respiratory distress.     Breath sounds: Normal breath sounds.  Chest:     Chest wall: No mass or swelling.     Breasts:        Right: Normal. No tenderness.        Left: Normal. No tenderness.  Abdominal:     General: Abdomen is flat. Bowel sounds are normal.     Palpations: Abdomen is soft.  Musculoskeletal: Normal range of motion.        General: No swelling.     Right lower leg: No edema.     Left lower leg: No edema.  Skin:    General: Skin is warm and dry.     Capillary Refill: Capillary refill takes less than 2 seconds.  Neurological:     General: No focal deficit present.     Mental Status: She is alert and oriented to person, place, and time.     Cranial Nerves: No cranial nerve deficit.     Sensory: No sensory deficit.  Psychiatric:        Mood and Affect: Mood normal.        Behavior: Behavior normal.        Thought  Content: Thought content normal.        Judgment: Judgment normal.         Assessment And Plan:     1. Health maintenance examination . Behavior modifications discussed and diet history reviewed.   . Pt will continue to exercise regularly and modify diet with low GI, plant based foods and decrease intake of processed foods.  . Recommend intake of daily  multivitamin, Vitamin D, and calcium.  . Recommend mammogram and colonoscopy for preventive screenings, as well as recommend immunizations that include influenza, TDAP, and Shingles  2. Morbid obesity due to excess calories (HCC)  Chronic  Discussed healthy diet and regular exercise options   Encouraged to exercise at least 150 minutes per week with 2 days of strength training - Referral to Nutrition and Diabetes Services  3. Body mass index (BMI) 40.0-44.9, adult [facility]  - Referral to Nutrition and Diabetes Services  4. Screening mammogram, encounter for  Pt instructed on Self Breast Exam.According to ACOG guidelines Women aged 82 and older are recommended to get an annual mammogram. Form completed and given to patient contact the [facility] for appointment scheduing.   Pt encouraged to get annual mammogram - MM Digital Screening; Future  5. Blood pressure elevated without history of HTN  Blood pressure today is elevated, not controlled  Discussed importance of avoid foods high in salt and increasing water intake.  6. Encounter for hepatitis C screening test for low risk patient  Will check for Hepatitis C screening due to being born between the years 1945-1965  7. Encounter for screening examination for sexually transmitted disease   8. Encounter for immunization Will give tetanus vaccine today while in office. Refer to order management. TDAP will be administered to adults 84-42 years old every 10 years.   Arnette Felts, FNP

## 2018-03-18 LAB — LIPID PANEL
CHOLESTEROL TOTAL: 175 mg/dL (ref 100–199)
Chol/HDL Ratio: 2.7 ratio (ref 0.0–4.4)
HDL: 65 mg/dL (ref >39)
LDL CALC: 101 mg/dL — AB (ref 0–99)
Triglycerides: 45 mg/dL (ref 0–149)
VLDL Cholesterol Cal: 9 mg/dL (ref 5–40)

## 2018-03-18 LAB — CMP14 + ANION GAP
ALBUMIN: 4.1 g/dL (ref 3.8–4.9)
ALT: 9 IU/L (ref 0–32)
ANION GAP: 15 mmol/L (ref 10.0–18.0)
AST: 15 IU/L (ref 0–40)
Albumin/Globulin Ratio: 1.2 (ref 1.2–2.2)
Alkaline Phosphatase: 80 IU/L (ref 39–117)
BILIRUBIN TOTAL: 0.5 mg/dL (ref 0.0–1.2)
BUN/Creatinine Ratio: 16 (ref 9–23)
BUN: 14 mg/dL (ref 6–24)
CALCIUM: 9.4 mg/dL (ref 8.7–10.2)
CHLORIDE: 102 mmol/L (ref 96–106)
CO2: 23 mmol/L (ref 20–29)
Creatinine, Ser: 0.89 mg/dL (ref 0.57–1.00)
GFR, EST AFRICAN AMERICAN: 82 mL/min/{1.73_m2} (ref >59)
GFR, EST NON AFRICAN AMERICAN: 71 mL/min/{1.73_m2} (ref >59)
GLUCOSE: 83 mg/dL (ref 65–99)
Globulin, Total: 3.4 g/dL (ref 1.5–4.5)
Potassium: 4.2 mmol/L (ref 3.5–5.2)
Sodium: 140 mmol/L (ref 134–144)
TOTAL PROTEIN: 7.5 g/dL (ref 6.0–8.5)

## 2018-03-18 LAB — HIV ANTIBODY (ROUTINE TESTING W REFLEX): HIV Screen 4th Generation wRfx: NONREACTIVE

## 2018-03-18 LAB — CBC
HEMATOCRIT: 37.2 % (ref 34.0–46.6)
HEMOGLOBIN: 12.4 g/dL (ref 11.1–15.9)
MCH: 30.2 pg (ref 26.6–33.0)
MCHC: 33.3 g/dL (ref 31.5–35.7)
MCV: 91 fL (ref 79–97)
Platelets: 243 10*3/uL (ref 150–450)
RBC: 4.1 x10E6/uL (ref 3.77–5.28)
RDW: 12.2 % (ref 11.7–15.4)
WBC: 4.4 10*3/uL (ref 3.4–10.8)

## 2018-03-18 LAB — HEPATITIS C ANTIBODY: Hep C Virus Ab: <0.1 s/co ratio (ref 0.0–0.9)

## 2018-03-18 LAB — HEMOGLOBIN A1C
Est. average glucose Bld gHb Est-mCnc: 105 mg/dL
Hgb A1c MFr Bld: 5.3 % (ref 4.8–5.6)

## 2018-03-21 LAB — URINE CYTOLOGY ANCILLARY ONLY
CHLAMYDIA, DNA PROBE: NEGATIVE
Neisseria Gonorrhea: NEGATIVE
TRICH (WINDOWPATH): NEGATIVE

## 2018-03-28 ENCOUNTER — Encounter: Payer: Self-pay | Admitting: Nurse Practitioner

## 2018-05-16 ENCOUNTER — Ambulatory Visit (INDEPENDENT_AMBULATORY_CARE_PROVIDER_SITE_OTHER): Payer: No Typology Code available for payment source | Admitting: Nurse Practitioner

## 2018-05-16 ENCOUNTER — Other Ambulatory Visit: Payer: Self-pay

## 2018-05-16 ENCOUNTER — Encounter: Payer: Self-pay | Admitting: Nurse Practitioner

## 2018-05-16 DIAGNOSIS — Z6841 Body Mass Index (BMI) 40.0 and over, adult: Secondary | ICD-10-CM

## 2018-05-16 MED ORDER — PHENTERMINE HCL 37.5 MG PO CAPS: 37.5000 mg | ORAL_CAPSULE | ORAL | 1 refills | Status: DC

## 2018-05-16 NOTE — Progress Notes (Signed)
Virtual Visit via Video Note   This visit type was conducted due to national recommendations for restrictions regarding the COVID-19 Pandemic (e.g. social distancing) in an effort to limit this patient's exposure and mitigate transmission in our community.  This format is felt to be most appropriate for this patient at this time.  All issues noted in this document were discussed and addressed.  No physical exam was performed (except for noted visual exam findings with Video Visits).  Please refer to the patient's chart (MyChart message for video visits and phone note for telephone visits) for the patient's consent to telehealth for [facility].  Date:  05/16/2018   ID:  Danielle Rogers, DOB 1960/08/06, MRN [id number]  Patient Location:  Home  Provider location:   Home    Chief Complaint:  Weight check  History of Present Illness:    Danielle Rogers is a 58 y.o. female who presents via video conferencing for a telehealth visit today.    The patient does not have symptoms concerning for COVID-19 infection (fever, chills, cough, or new shortness of breath).   Obesity - she is taking phentermine 15mg .  She is exercising at home - sit ups, hand weight. When commercials come on TV she will walk the hall.  Still drinks ginger ale - glass full when she wakes up     Past Medical History:  Diagnosis Date  . Hypertension   . Obesity    Past Surgical History:  Procedure Laterality Date  . TUBAL LIGATION       Current Meds  Medication Sig  . acetaminophen (TYLENOL) 500 MG tablet Take 1 tablet (500 mg total) by mouth every 6 (six) hours as needed.  . hydrochlorothiazide (HYDRODIURIL) 12.5 MG tablet Take 1 tablet (12.5 mg total) by mouth daily.  Marland Kitchen ibuprofen (ADVIL,MOTRIN) 800 MG tablet Take 1 tablet (800 mg total) by mouth 3 (three) times daily.  . meclizine (ANTIVERT) 25 MG tablet Take 1 tablet (25 mg total) by mouth 2 (two) times daily as needed for dizziness.     Allergies:   Patient has no  known allergies.   Social History   Tobacco Use  . Smoking status: Never Smoker  . Smokeless tobacco: Never Used  Substance Use Topics  . Alcohol use: No  . Drug use: No     Family Hx: The patient's family history is not on file.  ROS:   Please see the history of present illness.    Review of Systems  Constitutional: Negative for fever and weight loss.  Respiratory: Negative.   Cardiovascular: Negative for chest pain and palpitations.  Neurological: Negative for dizziness and tingling.    All other systems reviewed and are negative.   Labs/Other Tests and Data Reviewed:    Recent Labs: 03/17/2018: ALT 9; BUN 14; Creatinine, Ser 0.89; Hemoglobin 12.4; Platelets 243; Potassium 4.2; Sodium 140   Recent Lipid Panel Lab Results  Component Value Date/Time   CHOL 175 03/17/2018 11:42 AM   TRIG 45 03/17/2018 11:42 AM   HDL 65 03/17/2018 11:42 AM   CHOLHDL 2.7 03/17/2018 11:42 AM   LDLCALC 101 (H) 03/17/2018 11:42 AM    Wt Readings from Last 3 Encounters:  03/17/18 236 lb (107 kg)  03/03/18 190 lb (86.2 kg)  05/06/17 245 lb 2 oz (111.2 kg)     Exam:    Vital Signs:  LMP 08/31/2011     Physical Exam  Constitutional: She is oriented to person, place, and time and well-developed, well-nourished,  and in no distress.  Pulmonary/Chest: Effort normal.  Neurological: She is alert and oriented to person, place, and time.  Psychiatric: Mood, memory, affect and judgment normal.    ASSESSMENT & PLAN:    1. Morbid obesity due to excess calories (HCC)  Increase physical activity to 20 minutes  Goal to lose 10 lbs by her next visit in 2 months  Encouraged to keep a food log - phentermine 37.5 MG capsule; Take 1 capsule (37.5 mg total) by mouth every morning.  Dispense: 30 capsule; Refill: 1  2. Body mass index (BMI) 40.0-44.9, adult (HCC)  - phentermine 37.5 MG capsule; Take 1 capsule (37.5 mg total) by mouth every morning.  Dispense: 30 capsule; Refill: 1   COVID-19  Education: The signs and symptoms of COVID-19 were discussed with the patient and how to seek care for testing (follow up with PCP or arrange E-visit).  The importance of social distancing was discussed today.  Patient Risk:   After full review of this patients clinical status, I feel that they are at least moderate risk at this time.  Time:   Today, I have spent 19 minutes/ seconds with the patient with telehealth technology discussing above diagnoses.     Medication Adjustments/Labs and Tests Ordered: Current medicines are reviewed at length with the patient today.  Concerns regarding medicines are outlined above.   Tests Ordered: No orders of the defined types were placed in this encounter.   Medication Changes: No orders of the defined types were placed in this encounter.   Disposition:  Follow up in 2 month(s)  Signed, Arnette FeltsJanece Bruce Mayers, FNP

## 2018-05-19 ENCOUNTER — Ambulatory Visit: Payer: No Typology Code available for payment source | Admitting: Nurse Practitioner

## 2018-07-12 ENCOUNTER — Ambulatory Visit: Payer: No Typology Code available for payment source | Admitting: Nurse Practitioner

## 2018-11-23 ENCOUNTER — Ambulatory Visit (INDEPENDENT_AMBULATORY_CARE_PROVIDER_SITE_OTHER): Payer: No Typology Code available for payment source | Admitting: Nurse Practitioner

## 2018-11-23 ENCOUNTER — Encounter: Payer: Self-pay | Admitting: Nurse Practitioner

## 2018-11-23 ENCOUNTER — Other Ambulatory Visit: Payer: Self-pay

## 2018-11-23 VITALS — BP 104/60 | HR 92 | Temp 98.5°F | Ht 63.8 in | Wt 238.6 lb

## 2018-11-23 DIAGNOSIS — H6122 Impacted cerumen, left ear: Secondary | ICD-10-CM | POA: Diagnosis not present

## 2018-11-23 DIAGNOSIS — I10 Essential (primary) hypertension: Secondary | ICD-10-CM | POA: Diagnosis not present

## 2018-11-23 DIAGNOSIS — M79641 Pain in right hand: Secondary | ICD-10-CM | POA: Diagnosis not present

## 2018-11-23 MED ORDER — HYDROCHLOROTHIAZIDE 12.5 MG PO TABS: 12.5000 mg | ORAL_TABLET | Freq: Every day | ORAL | 0 refills | Status: DC

## 2018-11-23 MED ORDER — HYDROCHLOROTHIAZIDE 12.5 MG PO TABS: 12.5000 mg | ORAL_TABLET | Freq: Every day | ORAL | 1 refills | Status: DC

## 2018-11-23 NOTE — Progress Notes (Signed)
Subjective:     Patient ID: Danielle Rogers , female    DOB: 1960/03/27 , 58 y.o.   MRN: [id number]   Chief Complaint  Patient presents with  . Hypertension  . Hand Pain    patient stated her hands have been hurting for the past 2 months she has been unable to sleep at night due to the pain    HPI  Wt Readings from Last 3 Encounters: 11/23/18 : 238 lb 9.6 oz (108.2 kg) 03/17/18 : 236 lb (107 kg) 03/03/18 : 190 lb (86.2 kg)  She is exercising on treadmill.  She is exercising 4 days a week for the last 2 months.    Hypertension This is a chronic problem. The current episode started more than 1 year ago. Pertinent negatives include no chest pain or palpitations.  Hand Pain  The incident occurred more than 1 week ago (2 months ago). There was no injury mechanism. The pain is present in the right hand. The quality of the pain is described as aching. The pain does not radiate. Pertinent negatives include no chest pain. Improvement on treatment: Keeps her up at night.     Past Medical History:  Diagnosis Date  . Hypertension   . Obesity       No family history on file.   Current Outpatient Medications:  .  acetaminophen (TYLENOL) 500 MG tablet, Take 1 tablet (500 mg total) by mouth every 6 (six) hours as needed., Disp: 30 tablet, Rfl: 0 .  hydrochlorothiazide (HYDRODIURIL) 12.5 MG tablet, Take 1 tablet (12.5 mg total) by mouth daily., Disp: 90 tablet, Rfl: 0 .  ibuprofen (ADVIL,MOTRIN) 800 MG tablet, Take 1 tablet (800 mg total) by mouth 3 (three) times daily., Disp: 21 tablet, Rfl: 0 .  meclizine (ANTIVERT) 25 MG tablet, Take 1 tablet (25 mg total) by mouth 2 (two) times daily as needed for dizziness., Disp: 15 tablet, Rfl: 0 .  phentermine 37.5 MG capsule, Take 1 capsule (37.5 mg total) by mouth every morning., Disp: 30 capsule, Rfl: 1 .  benzonatate (TESSALON) 100 MG capsule, Take 2 capsules (200 mg total) by mouth 2 (two) times daily as needed for cough. (Patient not taking:  Reported on 03/17/2018), Disp: 20 capsule, Rfl: 0 .  cyclobenzaprine (FLEXERIL) 10 MG tablet, Take 1 tablet (10 mg total) by mouth 3 (three) times daily as needed for muscle spasms. (Patient not taking: Reported on 03/06/2018), Disp: 15 tablet, Rfl: 0 .  HYDROcodone-acetaminophen (LORTAB 5) 5-500 MG per tablet, Take 1 tablet by mouth every 6 (six) hours as needed for pain. (Patient not taking: Reported on 03/06/2018), Disp: 15 tablet, Rfl: 0 .  HYDROcodone-acetaminophen (NORCO/VICODIN) 5-325 MG per tablet, Take 2 tablets by mouth every 4 (four) hours as needed for moderate pain. (Patient not taking: Reported on 03/06/2018), Disp: 20 tablet, Rfl: 0 .  meloxicam (MOBIC) 7.5 MG tablet, Take 1 tablet (7.5 mg total) by mouth daily. (Patient not taking: Reported on 03/06/2018), Disp: 20 tablet, Rfl: 0 .  naproxen (NAPROSYN) 500 MG tablet, Take 1 tablet (500 mg total) by mouth 2 (two) times daily as needed for mild pain, moderate pain or headache (TAKE WITH MEALS.). (Patient not taking: Reported on 03/06/2018), Disp: 20 tablet, Rfl: 0 .  ondansetron (ZOFRAN ODT) 4 MG disintegrating tablet, Take 1 tablet (4 mg total) by mouth every 8 (eight) hours as needed for nausea or vomiting. (Patient not taking: Reported on 03/17/2018), Disp: 10 tablet, Rfl: 0   No Known Allergies  Review of Systems  Constitutional: Negative.   Respiratory: Negative.  Negative for cough.   Cardiovascular: Negative for chest pain, palpitations and leg swelling.  Musculoskeletal: Positive for arthralgias (right hand pain). Negative for back pain.  Neurological: Negative.   Psychiatric/Behavioral: Negative.      Today's Vitals   11/23/18 1533  BP: 104/60  Pulse: 92  Temp: 98.5 F (36.9 C)  TempSrc: Oral  Weight: 238 lb 9.6 oz (108.2 kg)  Height: 5' 3.8" (1.621 m)  PainSc: 8   PainLoc: Hand   Body mass index is 41.21 kg/m.   Objective:  Physical Exam Constitutional:      Appearance: Normal appearance.  Cardiovascular:      Rate and Rhythm: Normal rate and regular rhythm.     Pulses: Normal pulses.     Heart sounds: Normal heart sounds. No murmur.  Pulmonary:     Effort: Pulmonary effort is normal. No respiratory distress.     Breath sounds: Normal breath sounds.  Musculoskeletal:     Right hand: She exhibits normal range of motion, no tenderness and no swelling.     Left hand: Normal.  Skin:    General: Skin is warm and dry.     Capillary Refill: Capillary refill takes less than 2 seconds.  Neurological:     General: No focal deficit present.     Mental Status: She is alert and oriented to person, place, and time.         Assessment And Plan:     1. Essential hypertension Chronic, well controlled  Continue with current medications, will check GFR for kidney function - BMP8+eGFR - hydrochlorothiazide (HYDRODIURIL) 12.5 MG tablet; Take 1 tablet (12.5 mg total) by mouth daily.  Dispense: 90 tablet; Refill: 1  2. Excessive cerumen in ear canal, left  Excessive cerumen to left ear canal, soft  Unable to visualize TM fully  Advised to cleanse with 1/2 water and 1/2 peroxide   I will not flush her ears due to having discomfort  3. Right hand pain  No pain on palpation  She is to get a wrist splint  This may be carpal tunnel with the pain more at night and a previous history carpal tunnel  Negative phalen and tinel   Minette Brine, FNP    THE PATIENT IS ENCOURAGED TO PRACTICE SOCIAL DISTANCING DUE TO THE COVID-19 PANDEMIC.

## 2018-11-23 NOTE — Patient Instructions (Signed)
Earwax Buildup, Adult The ears produce a substance called earwax that helps keep bacteria out of the ear and protects the skin in the ear canal. Occasionally, earwax can build up in the ear and cause discomfort or hearing loss. What increases the risk? This condition is more likely to develop in people who:  Are female.  Are elderly.  Naturally produce more earwax.  Clean their ears often with cotton swabs.  Use earplugs often.  Use in-ear headphones often.  Wear hearing aids.  Have narrow ear canals.  Have earwax that is overly thick or sticky.  Have eczema.  Are dehydrated.  Have excess hair in the ear canal. What are the signs or symptoms? Symptoms of this condition include:  Reduced or muffled hearing.  A feeling of fullness in the ear or feeling that the ear is plugged.  Fluid coming from the ear.  Ear pain.  Ear itch.  Ringing in the ear.  Coughing.  An obvious piece of earwax that can be seen inside the ear canal. How is this diagnosed? This condition may be diagnosed based on:  Your symptoms.  Your medical history.  An ear exam. During the exam, your health care provider will look into your ear with an instrument called an otoscope. You may have tests, including a hearing test. How is this treated? This condition may be treated by:  Using ear drops to soften the earwax.  Having the earwax removed by a health care provider. The health care provider may: ? Flush the ear with water. ? Use an instrument that has a loop on the end (curette). ? Use a suction device.  Surgery to remove the wax buildup. This may be done in severe cases. Follow these instructions at home:   Take over-the-counter and prescription medicines only as told by your health care provider.  Do not put any objects, including cotton swabs, into your ear. You can clean the opening of your ear canal with a washcloth or facial tissue.  Follow instructions from your health care  provider about cleaning your ears. Do not over-clean your ears.  Drink enough fluid to keep your urine clear or pale yellow. This will help to thin the earwax.  Keep all follow-up visits as told by your health care provider. If earwax builds up in your ears often or if you use hearing aids, consider seeing your health care provider for routine, preventive ear cleanings. Ask your health care provider how often you should schedule your cleanings.  If you have hearing aids, clean them according to instructions from the manufacturer and your health care provider. Contact a health care provider if:  You have ear pain.  You develop a fever.  You have blood, pus, or other fluid coming from your ear.  You have hearing loss.  You have ringing in your ears that does not go away.  Your symptoms do not improve with treatment.  You feel like the room is spinning (vertigo). Summary  Earwax can build up in the ear and cause discomfort or hearing loss.  The most common symptoms of this condition include reduced or muffled hearing and a feeling of fullness in the ear or feeling that the ear is plugged.  This condition may be diagnosed based on your symptoms, your medical history, and an ear exam.  This condition may be treated by using ear drops to soften the earwax or by having the earwax removed by a health care provider.  Do not put any   objects, including cotton swabs, into your ear. You can clean the opening of your ear canal with a washcloth or facial tissue. This information is not intended to replace advice given to you by your health care provider. Make sure you discuss any questions you have with your health care provider. Document Released: 02/19/2004 Document Revised: 12/24/2016 Document Reviewed: 03/24/2016 Elsevier Patient Education  [place].   Use 1/2 water and 1/2 peroxide on a cotton ball to ear canal and you can use debrox drops over the counter for wax buildup   Wear hand/wrist splint at night.

## 2018-11-24 LAB — BMP8+EGFR
BUN/Creatinine Ratio: 22 (ref 9–23)
BUN: 19 mg/dL (ref 6–24)
CO2: 24 mmol/L (ref 20–29)
Calcium: 9.2 mg/dL (ref 8.7–10.2)
Chloride: 101 mmol/L (ref 96–106)
Creatinine, Ser: 0.85 mg/dL (ref 0.57–1.00)
GFR calc Af Amer: 87 mL/min/{1.73_m2} (ref >59)
GFR calc non Af Amer: 75 mL/min/{1.73_m2} (ref >59)
Glucose: 102 mg/dL — ABNORMAL HIGH (ref 65–99)
Potassium: 4.2 mmol/L (ref 3.5–5.2)
Sodium: 139 mmol/L (ref 134–144)

## 2019-03-22 ENCOUNTER — Encounter: Payer: No Typology Code available for payment source | Admitting: Nurse Practitioner

## 2019-03-23 ENCOUNTER — Encounter: Payer: No Typology Code available for payment source | Admitting: Internal Medicine

## 2019-05-14 ENCOUNTER — Ambulatory Visit (INDEPENDENT_AMBULATORY_CARE_PROVIDER_SITE_OTHER): Payer: No Typology Code available for payment source | Admitting: Nurse Practitioner

## 2019-05-14 ENCOUNTER — Other Ambulatory Visit: Payer: Self-pay

## 2019-05-14 ENCOUNTER — Encounter: Payer: Self-pay | Admitting: Nurse Practitioner

## 2019-05-14 VITALS — BP 122/80 | HR 87 | Temp 97.4°F | Ht 63.8 in | Wt 235.4 lb

## 2019-05-14 DIAGNOSIS — Z1231 Encounter for screening mammogram for malignant neoplasm of breast: Secondary | ICD-10-CM | POA: Diagnosis not present

## 2019-05-14 DIAGNOSIS — Z0000 Encounter for general adult medical examination without abnormal findings: Secondary | ICD-10-CM

## 2019-05-14 DIAGNOSIS — H9202 Otalgia, left ear: Secondary | ICD-10-CM | POA: Diagnosis not present

## 2019-05-14 DIAGNOSIS — I10 Essential (primary) hypertension: Secondary | ICD-10-CM

## 2019-05-14 NOTE — Patient Instructions (Signed)
Health Maintenance, Female Adopting a healthy lifestyle and getting preventive care are important in promoting health and wellness. Ask your health care provider about:  The right schedule for you to have regular tests and exams.  Things you can do on your own to prevent diseases and keep yourself healthy. What should I know about diet, weight, and exercise? Eat a healthy diet   Eat a diet that includes plenty of vegetables, fruits, low-fat dairy products, and lean protein.  Do not eat a lot of foods that are high in solid fats, added sugars, or sodium. Maintain a healthy weight Body mass index (BMI) is used to identify weight problems. It estimates body fat based on height and weight. Your health care provider can help determine your BMI and help you achieve or maintain a healthy weight. Get regular exercise Get regular exercise. This is one of the most important things you can do for your health. Most adults should:  Exercise for at least 150 minutes each week. The exercise should increase your heart rate and make you sweat (moderate-intensity exercise).  Do strengthening exercises at least twice a week. This is in addition to the moderate-intensity exercise.  Spend less time sitting. Even light physical activity can be beneficial. Watch cholesterol and blood lipids Have your blood tested for lipids and cholesterol at 61 years of age, then have this test every 5 years. Have your cholesterol levels checked more often if:  Your lipid or cholesterol levels are high.  You are older than 61 years of age.  You are at high risk for heart disease. What should I know about cancer screening? Depending on your health history and family history, you may need to have cancer screening at various ages. This may include screening for:  Breast cancer.  Cervical cancer.  Colorectal cancer.  Skin cancer.  Lung cancer. What should I know about heart disease, diabetes, and high blood  pressure? Blood pressure and heart disease  High blood pressure causes heart disease and increases the risk of stroke. This is more likely to develop in people who have high blood pressure readings, are of African descent, or are overweight.  Have your blood pressure checked: ? Every 3-5 years if you are 18-39 years of age. ? Every year if you are 40 years old or older. Diabetes Have regular diabetes screenings. This checks your fasting blood sugar level. Have the screening done:  Once every three years after age 40 if you are at a normal weight and have a low risk for diabetes.  More often and at a younger age if you are overweight or have a high risk for diabetes. What should I know about preventing infection? Hepatitis B If you have a higher risk for hepatitis B, you should be screened for this virus. Talk with your health care provider to find out if you are at risk for hepatitis B infection. Hepatitis C Testing is recommended for:  Everyone born from 1945 through 1965.  Anyone with known risk factors for hepatitis C. Sexually transmitted infections (STIs)  Get screened for STIs, including gonorrhea and chlamydia, if: ? You are sexually active and are younger than 61 years of age. ? You are older than 61 years of age and your health care provider tells you that you are at risk for this type of infection. ? Your sexual activity has changed since you were last screened, and you are at increased risk for chlamydia or gonorrhea. Ask your health care provider if   you are at risk.  Ask your health care provider about whether you are at high risk for HIV. Your health care provider may recommend a prescription medicine to help prevent HIV infection. If you choose to take medicine to prevent HIV, you should first get tested for HIV. You should then be tested every 3 months for as long as you are taking the medicine. Pregnancy  If you are about to stop having your period (premenopausal) and  you may become pregnant, seek counseling before you get pregnant.  Take 400 to 800 micrograms (mcg) of folic acid every day if you become pregnant.  Ask for birth control (contraception) if you want to prevent pregnancy. Osteoporosis and menopause Osteoporosis is a disease in which the bones lose minerals and strength with aging. This can result in bone fractures. If you are 65 years old or older, or if you are at risk for osteoporosis and fractures, ask your health care provider if you should:  Be screened for bone loss.  Take a calcium or vitamin D supplement to lower your risk of fractures.  Be given hormone replacement therapy (HRT) to treat symptoms of menopause. Follow these instructions at home: Lifestyle  Do not use any products that contain nicotine or tobacco, such as cigarettes, e-cigarettes, and chewing tobacco. If you need help quitting, ask your health care provider.  Do not use street drugs.  Do not share needles.  Ask your health care provider for help if you need support or information about quitting drugs. Alcohol use  Do not drink alcohol if: ? Your health care provider tells you not to drink. ? You are pregnant, may be pregnant, or are planning to become pregnant.  If you drink alcohol: ? Limit how much you use to 0-1 drink a day. ? Limit intake if you are breastfeeding.  Be aware of how much alcohol is in your drink. In the [place] one drink equals one 12 oz bottle of beer (355 mL), one 5 oz glass of wine (148 mL), or one 1 oz glass of hard liquor (44 mL). General instructions  Schedule regular health, dental, and eye exams.  Stay current with your vaccines.  Tell your health care provider if: ? You often feel depressed. ? You have ever been abused or do not feel safe at home. Summary  Adopting a healthy lifestyle and getting preventive care are important in promoting health and wellness.  Follow your health care provider's instructions about healthy  diet, exercising, and getting tested or screened for diseases.  Follow your health care provider's instructions on monitoring your cholesterol and blood pressure. This information is not intended to replace advice given to you by your health care provider. Make sure you discuss any questions you have with your health care provider. Document Revised: 01/04/2018 Document Reviewed: 01/04/2018 Elsevier Patient Education  2020 [employer]  Exercising to Lose Weight Exercise is structured, repetitive physical activity to improve fitness and health. Getting regular exercise is important for everyone. It is especially important if you are overweight. Being overweight increases your risk of heart disease, stroke, diabetes, high blood pressure, and several types of cancer. Reducing your calorie intake and exercising can help you lose weight. Exercise is usually categorized as moderate or vigorous intensity. To lose weight, most people need to do a certain amount of moderate-intensity or vigorous-intensity exercise each week. Moderate-intensity exercise  Moderate-intensity exercise is any activity that gets you moving enough to burn at least three times more energy (calories)   than if you were sitting. Examples of moderate exercise include:  Walking a mile in 15 minutes.  Doing light yard work.  Biking at an easy pace. Most people should get at least 150 minutes (2 hours and 30 minutes) a week of moderate-intensity exercise to maintain their body weight. Vigorous-intensity exercise Vigorous-intensity exercise is any activity that gets you moving enough to burn at least six times more calories than if you were sitting. When you exercise at this intensity, you should be working hard enough that you are not able to carry on a conversation. Examples of vigorous exercise include:  Running.  Playing a team sport, such as football, basketball, and soccer.  Jumping rope. Most people should get at least  75 minutes (1 hour and 15 minutes) a week of vigorous-intensity exercise to maintain their body weight. How can exercise affect me? When you exercise enough to burn more calories than you eat, you lose weight. Exercise also reduces body fat and builds muscle. The more muscle you have, the more calories you burn. Exercise also:  Improves mood.  Reduces stress and tension.  Improves your overall fitness, flexibility, and endurance.  Increases bone strength. The amount of exercise you need to lose weight depends on:  Your age.  The type of exercise.  Any health conditions you have.  Your overall physical ability. Talk to your health care provider about how much exercise you need and what types of activities are safe for you. What actions can I take to lose weight? Nutrition   Make changes to your diet as told by your health care provider or diet and nutrition specialist (dietitian). This may include: ? Eating fewer calories. ? Eating more protein. ? Eating less unhealthy fats. ? Eating a diet that includes fresh fruits and vegetables, whole grains, low-fat dairy products, and lean protein. ? Avoiding foods with added fat, salt, and sugar.  Drink plenty of water while you exercise to prevent dehydration or heat stroke. Activity  Choose an activity that you enjoy and set realistic goals. Your health care provider can help you make an exercise plan that works for you.  Exercise at a moderate or vigorous intensity most days of the week. ? The intensity of exercise may vary from person to person. You can tell how intense a workout is for you by paying attention to your breathing and heartbeat. Most people will notice their breathing and heartbeat get faster with more intense exercise.  Do resistance training twice each week, such as: ? Push-ups. ? Sit-ups. ? Lifting weights. ? Using resistance bands.  Getting short amounts of exercise can be just as helpful as long structured  periods of exercise. If you have trouble finding time to exercise, try to include exercise in your daily routine. ? Get up, stretch, and walk around every 30 minutes throughout the day. ? Go for a walk during your lunch break. ? Park your car farther away from your destination. ? If you take public transportation, get off one stop early and walk the rest of the way. ? Make phone calls while standing up and walking around. ? Take the stairs instead of elevators or escalators.  Wear comfortable clothes and shoes with good support.  Do not exercise so much that you hurt yourself, feel dizzy, or get very short of breath. Where to find more information  [place] Department of Health and Human Services: www.hhs.gov  Centers for Disease Control and Prevention (CDC): www.cdc.gov Contact a health care provider:    Before starting a new exercise program.  If you have questions or concerns about your weight.  If you have a medical problem that keeps you from exercising. Get help right away if you have any of the following while exercising:  Injury.  Dizziness.  Difficulty breathing or shortness of breath that does not go away when you stop exercising.  Chest pain.  Rapid heartbeat. Summary  Being overweight increases your risk of heart disease, stroke, diabetes, high blood pressure, and several types of cancer.  Losing weight happens when you burn more calories than you eat.  Reducing the amount of calories you eat in addition to getting regular moderate or vigorous exercise each week helps you lose weight. This information is not intended to replace advice given to you by your health care provider. Make sure you discuss any questions you have with your health care provider. Document Revised: 01/24/2017 Document Reviewed: 01/24/2017 Elsevier Patient Education  2020 Elsevier Inc.   

## 2019-05-14 NOTE — Progress Notes (Signed)
This visit occurred during the SARS-CoV-2 public health emergency.  Safety protocols were in place, including screening questions prior to the visit, additional usage of staff PPE, and extensive cleaning of exam room while observing appropriate contact time as indicated for disinfecting solutions.  Subjective:     Patient ID: Danielle Rogers , female    DOB: 18-Jun-1960 , 59 y.o.   MRN: [id number]   Chief Complaint  Patient presents with  . Annual Exam  . Otalgia    HPI  Here for HM   Otalgia  There is pain in the left ear. This is a recurrent problem. The current episode started 1 to 4 weeks ago. The problem occurs every few minutes. The problem has been unchanged. There has been no fever. Associated symptoms include rhinorrhea. Pertinent negatives include no coughing, headaches or sore throat. She has tried nothing for the symptoms. There is no history of hearing loss.     Patient's last menstrual period was 08/31/2011.. Negative for Dysmenorrhea Mammogram last done  2016. Negative for: breast discharge, breast lump(s), breast pain and breast self exam.  Pertinent negatives include abnormal bleeding (hematology), anxiety, decreased libido, depression, difficulty falling sleep, dyspareunia, history of infertility, nocturia, sexual dysfunction, sleep disturbances, urinary incontinence, urinary urgency, vaginal discharge and vaginal itching.  Diet: eating more fruit, low carb. She is drinking more water. She has cut the pepsi off. Will occasionally drink unsweet tea.  The patient states her exercise level is minimal; not currently going to the gym.   Wt Readings from Last 3 Encounters:  05/14/19 235 lb 6.4 oz (106.8 kg)  11/23/18 238 lb 9.6 oz (108.2 kg)  03/17/18 236 lb (107 kg)    The patient's tobacco use is:  Social History   Tobacco Use  Smoking Status Never Smoker  Smokeless Tobacco Never Used   She has been exposed to passive smoke. The patient's alcohol use is:  Social  History   Substance and Sexual Activity  Alcohol Use No   Additional information: Last pap hysterectomy Past Medical History:  Diagnosis Date  . Hypertension   . Obesity      History reviewed. No pertinent family history.   Current Outpatient Medications:  .  hydrochlorothiazide (HYDRODIURIL) 12.5 MG tablet, Take 1 tablet (12.5 mg total) by mouth daily., Disp: 90 tablet, Rfl: 1 .  acetaminophen (TYLENOL) 500 MG tablet, Take 1 tablet (500 mg total) by mouth every 6 (six) hours as needed. (Patient not taking: Reported on 05/14/2019), Disp: 30 tablet, Rfl: 0 .  cyclobenzaprine (FLEXERIL) 10 MG tablet, Take 1 tablet (10 mg total) by mouth 3 (three) times daily as needed for muscle spasms. (Patient not taking: Reported on 05/14/2019), Disp: 15 tablet, Rfl: 0 .  HYDROcodone-acetaminophen (LORTAB 5) 5-500 MG per tablet, Take 1 tablet by mouth every 6 (six) hours as needed for pain. (Patient not taking: Reported on 05/14/2019), Disp: 15 tablet, Rfl: 0 .  HYDROcodone-acetaminophen (NORCO/VICODIN) 5-325 MG per tablet, Take 2 tablets by mouth every 4 (four) hours as needed for moderate pain. (Patient not taking: Reported on 05/14/2019), Disp: 20 tablet, Rfl: 0 .  ibuprofen (ADVIL,MOTRIN) 800 MG tablet, Take 1 tablet (800 mg total) by mouth 3 (three) times daily. (Patient not taking: Reported on 05/14/2019), Disp: 21 tablet, Rfl: 0 .  meclizine (ANTIVERT) 25 MG tablet, Take 1 tablet (25 mg total) by mouth 2 (two) times daily as needed for dizziness. (Patient not taking: Reported on 05/14/2019), Disp: 15 tablet, Rfl: 0 .  meloxicam (MOBIC)  7.5 MG tablet, Take 1 tablet (7.5 mg total) by mouth daily. (Patient not taking: Reported on 05/14/2019), Disp: 20 tablet, Rfl: 0 .  naproxen (NAPROSYN) 500 MG tablet, Take 1 tablet (500 mg total) by mouth 2 (two) times daily as needed for mild pain, moderate pain or headache (TAKE WITH MEALS.). (Patient not taking: Reported on 05/14/2019), Disp: 20 tablet, Rfl: 0 .   ondansetron (ZOFRAN ODT) 4 MG disintegrating tablet, Take 1 tablet (4 mg total) by mouth every 8 (eight) hours as needed for nausea or vomiting. (Patient not taking: Reported on 05/14/2019), Disp: 10 tablet, Rfl: 0   No Known Allergies   Review of Systems  Constitutional: Negative.   HENT: Positive for ear pain (left ear pain) and rhinorrhea. Negative for sore throat.   Eyes: Negative.   Respiratory: Negative for cough.   Cardiovascular: Negative.  Negative for chest pain, palpitations and leg swelling.  Gastrointestinal: Negative.   Endocrine: Negative.   Genitourinary: Negative.   Musculoskeletal: Negative.   Allergic/Immunologic: Negative.   Neurological: Negative for dizziness and headaches.  Hematological: Negative.   Psychiatric/Behavioral: Negative.      Today's Vitals   05/14/19 0950  BP: 122/80  Pulse: 87  Temp: (!) 97.4 F (36.3 C)  Weight: 235 lb 6.4 oz (106.8 kg)  Height: 5' 3.8" (1.621 m)   Body mass index is 40.66 kg/m.   Objective:  Physical Exam Constitutional:      Appearance: Normal appearance. She is well-developed.  HENT:     Head: Normocephalic and atraumatic.     Right Ear: Hearing, ear canal and external ear normal. There is no impacted cerumen. Tympanic membrane is bulging.     Left Ear: Hearing, ear canal and external ear normal. There is no impacted cerumen. Tympanic membrane is bulging.     Nose: Nose normal.     Mouth/Throat:     Mouth: Mucous membranes are moist.  Eyes:     General: Lids are normal.     Conjunctiva/sclera: Conjunctivae normal.     Pupils: Pupils are equal, round, and reactive to light.     Funduscopic exam:    Right eye: No papilledema.        Left eye: No papilledema.  Neck:     Thyroid: No thyroid mass.     Vascular: No carotid bruit.  Cardiovascular:     Rate and Rhythm: Normal rate and regular rhythm.     Pulses: Normal pulses.     Heart sounds: Normal heart sounds. No murmur.  Pulmonary:     Effort: Pulmonary  effort is normal.     Breath sounds: Normal breath sounds.  Abdominal:     General: Abdomen is flat. Bowel sounds are normal.     Palpations: Abdomen is soft.  Musculoskeletal:        General: No swelling. Normal range of motion.     Cervical back: Full passive range of motion without pain, normal range of motion and neck supple.     Right lower leg: No edema.     Left lower leg: No edema.  Skin:    General: Skin is warm and dry.     Capillary Refill: Capillary refill takes less than 2 seconds.  Neurological:     General: No focal deficit present.     Mental Status: She is alert and oriented to person, place, and time.     Cranial Nerves: No cranial nerve deficit.     Sensory: No sensory deficit.  Psychiatric:        Mood and Affect: Mood normal.        Behavior: Behavior normal.        Thought Content: Thought content normal.        Judgment: Judgment normal.         Assessment And Plan:     1. Health maintenance examination Behavior modifications discussed and diet history reviewed.   Pt will continue to exercise regularly and modify diet with low GI, plant based foods and decrease intake of processed foods.  Recommend intake of daily multivitamin, Vitamin D, and calcium.  Recommend mammogram and colonoscopy (will get record from Dr. Collene Mares) for preventive screenings, as well as recommend immunizations that include influenza, TDAP (up to date) - Lipid panel - Hemoglobin A1c - CBC  2. Essential hypertension  Chronic, good control  Continue with current medications - CMP14+EGFR  3. Screening mammogram, encounter for  She will contact her insurance for her mammogram, I have also given her a coupon for [facility] $99 mammogram in the event her insurance does not cover.    Minette Brine, FNP    THE PATIENT IS ENCOURAGED TO PRACTICE SOCIAL DISTANCING DUE TO THE COVID-19 PANDEMIC.

## 2019-05-15 LAB — CMP14+EGFR
ALT: 9 IU/L (ref 0–32)
AST: 14 IU/L (ref 0–40)
Albumin/Globulin Ratio: 1.2 (ref 1.2–2.2)
Albumin: 4.5 g/dL (ref 3.8–4.9)
Alkaline Phosphatase: 99 IU/L (ref 39–117)
BUN/Creatinine Ratio: 18 (ref 12–28)
BUN: 17 mg/dL (ref 8–27)
Bilirubin Total: 0.4 mg/dL (ref 0.0–1.2)
CO2: 26 mmol/L (ref 20–29)
Calcium: 9.9 mg/dL (ref 8.7–10.3)
Chloride: 98 mmol/L (ref 96–106)
Creatinine, Ser: 0.92 mg/dL (ref 0.57–1.00)
GFR calc Af Amer: 78 mL/min/{1.73_m2} (ref >59)
GFR calc non Af Amer: 68 mL/min/{1.73_m2} (ref >59)
Globulin, Total: 3.8 g/dL (ref 1.5–4.5)
Glucose: 71 mg/dL (ref 65–99)
Potassium: 4.5 mmol/L (ref 3.5–5.2)
Sodium: 137 mmol/L (ref 134–144)
Total Protein: 8.3 g/dL (ref 6.0–8.5)

## 2019-05-15 LAB — LIPID PANEL
Chol/HDL Ratio: 2.7 ratio (ref 0.0–4.4)
Cholesterol, Total: 189 mg/dL (ref 100–199)
HDL: 71 mg/dL (ref >39)
LDL Chol Calc (NIH): 108 mg/dL — ABNORMAL HIGH (ref 0–99)
Triglycerides: 50 mg/dL (ref 0–149)
VLDL Cholesterol Cal: 10 mg/dL (ref 5–40)

## 2019-05-15 LAB — CBC
Hematocrit: 42.6 % (ref 34.0–46.6)
Hemoglobin: 13.9 g/dL (ref 11.1–15.9)
MCH: 29.6 pg (ref 26.6–33.0)
MCHC: 32.6 g/dL (ref 31.5–35.7)
MCV: 91 fL (ref 79–97)
Platelets: 228 10*3/uL (ref 150–450)
RBC: 4.7 x10E6/uL (ref 3.77–5.28)
RDW: 12.2 % (ref 11.7–15.4)
WBC: 4.1 10*3/uL (ref 3.4–10.8)

## 2019-05-15 LAB — HEMOGLOBIN A1C
Est. average glucose Bld gHb Est-mCnc: 111 mg/dL
Hgb A1c MFr Bld: 5.5 % (ref 4.8–5.6)

## 2019-05-21 ENCOUNTER — Other Ambulatory Visit: Payer: Self-pay | Admitting: Nurse Practitioner

## 2019-05-21 DIAGNOSIS — I10 Essential (primary) hypertension: Secondary | ICD-10-CM

## 2019-05-30 ENCOUNTER — Telehealth: Payer: Self-pay

## 2019-05-30 NOTE — Telephone Encounter (Signed)
I returned the pt's call and notified her of her most recent lab results. 
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
## 2019-11-18 ENCOUNTER — Other Ambulatory Visit: Payer: Self-pay | Admitting: Nurse Practitioner

## 2019-11-18 DIAGNOSIS — I10 Essential (primary) hypertension: Secondary | ICD-10-CM

## 2019-11-19 ENCOUNTER — Ambulatory Visit: Payer: No Typology Code available for payment source | Admitting: Nurse Practitioner

## 2020-02-23 ENCOUNTER — Other Ambulatory Visit: Payer: Self-pay | Admitting: Nurse Practitioner

## 2020-02-23 DIAGNOSIS — I10 Essential (primary) hypertension: Secondary | ICD-10-CM

## 2020-05-13 ENCOUNTER — Encounter: Payer: No Typology Code available for payment source | Admitting: Nurse Practitioner

## 2020-05-15 ENCOUNTER — Encounter: Payer: Self-pay | Admitting: Nurse Practitioner

## 2020-05-15 ENCOUNTER — Other Ambulatory Visit: Payer: Self-pay

## 2020-05-15 ENCOUNTER — Ambulatory Visit (INDEPENDENT_AMBULATORY_CARE_PROVIDER_SITE_OTHER): Payer: PRIVATE HEALTH INSURANCE | Admitting: Nurse Practitioner

## 2020-05-15 VITALS — BP 128/80 | HR 78 | Temp 98.3°F | Ht 63.8 in | Wt 234.2 lb

## 2020-05-15 DIAGNOSIS — Z0000 Encounter for general adult medical examination without abnormal findings: Secondary | ICD-10-CM

## 2020-05-15 DIAGNOSIS — H9202 Otalgia, left ear: Secondary | ICD-10-CM | POA: Diagnosis not present

## 2020-05-15 DIAGNOSIS — Z1231 Encounter for screening mammogram for malignant neoplasm of breast: Secondary | ICD-10-CM

## 2020-05-15 DIAGNOSIS — I10 Essential (primary) hypertension: Secondary | ICD-10-CM | POA: Diagnosis not present

## 2020-05-15 DIAGNOSIS — H6122 Impacted cerumen, left ear: Secondary | ICD-10-CM

## 2020-05-15 DIAGNOSIS — Z1211 Encounter for screening for malignant neoplasm of colon: Secondary | ICD-10-CM

## 2020-05-15 DIAGNOSIS — M79641 Pain in right hand: Secondary | ICD-10-CM | POA: Diagnosis not present

## 2020-05-15 DIAGNOSIS — Z6841 Body Mass Index (BMI) 40.0 and over, adult: Secondary | ICD-10-CM

## 2020-05-15 LAB — POCT URINALYSIS DIPSTICK
Bilirubin, UA: NEGATIVE
Blood, UA: NEGATIVE
Glucose, UA: NEGATIVE
Ketones, UA: NEGATIVE
Leukocytes, UA: NEGATIVE
Nitrite, UA: NEGATIVE
Protein, UA: NEGATIVE
Spec Grav, UA: 1.02 (ref 1.010–1.025)
Urobilinogen, UA: 1 E.U./dL
pH, UA: 7 (ref 5.0–8.0)

## 2020-05-15 LAB — POCT UA - MICROALBUMIN
Albumin/Creatinine Ratio, Urine, POC: 30
Creatinine, POC: 200 mg/dL
Microalbumin Ur, POC: 10 mg/L

## 2020-05-15 NOTE — Patient Instructions (Signed)
Health Maintenance, Female Adopting a healthy lifestyle and getting preventive care are important in promoting health and wellness. Ask your health care provider about:  The right schedule for you to have regular tests and exams.  Things you can do on your own to prevent diseases and keep yourself healthy. What should I know about diet, weight, and exercise? Eat a healthy diet  Eat a diet that includes plenty of vegetables, fruits, low-fat dairy products, and lean protein.  Do not eat a lot of foods that are high in solid fats, added sugars, or sodium.   Maintain a healthy weight Body mass index (BMI) is used to identify weight problems. It estimates body fat based on height and weight. Your health care provider can help determine your BMI and help you achieve or maintain a healthy weight. Get regular exercise Get regular exercise. This is one of the most important things you can do for your health. Most adults should:  Exercise for at least 150 minutes each week. The exercise should increase your heart rate and make you sweat (moderate-intensity exercise).  Do strengthening exercises at least twice a week. This is in addition to the moderate-intensity exercise.  Spend less time sitting. Even light physical activity can be beneficial. Watch cholesterol and blood lipids Have your blood tested for lipids and cholesterol at 62 years of age, then have this test every 5 years. Have your cholesterol levels checked more often if:  Your lipid or cholesterol levels are high.  You are older than 62 years of age.  You are at high risk for heart disease. What should I know about cancer screening? Depending on your health history and family history, you may need to have cancer screening at various ages. This may include screening for:  Breast cancer.  Cervical cancer.  Colorectal cancer.  Skin cancer.  Lung cancer. What should I know about heart disease, diabetes, and high blood  pressure? Blood pressure and heart disease  High blood pressure causes heart disease and increases the risk of stroke. This is more likely to develop in people who have high blood pressure readings, are of African descent, or are overweight.  Have your blood pressure checked: ? Every 3-5 years if you are 18-39 years of age. ? Every year if you are 40 years old or older. Diabetes Have regular diabetes screenings. This checks your fasting blood sugar level. Have the screening done:  Once every three years after age 40 if you are at a normal weight and have a low risk for diabetes.  More often and at a younger age if you are overweight or have a high risk for diabetes. What should I know about preventing infection? Hepatitis B If you have a higher risk for hepatitis B, you should be screened for this virus. Talk with your health care provider to find out if you are at risk for hepatitis B infection. Hepatitis C Testing is recommended for:  Everyone born from 1945 through 1965.  Anyone with known risk factors for hepatitis C. Sexually transmitted infections (STIs)  Get screened for STIs, including gonorrhea and chlamydia, if: ? You are sexually active and are younger than 62 years of age. ? You are older than 62 years of age and your health care provider tells you that you are at risk for this type of infection. ? Your sexual activity has changed since you were last screened, and you are at increased risk for chlamydia or gonorrhea. Ask your health care provider   if you are at risk.  Ask your health care provider about whether you are at high risk for HIV. Your health care provider may recommend a prescription medicine to help prevent HIV infection. If you choose to take medicine to prevent HIV, you should first get tested for HIV. You should then be tested every 3 months for as long as you are taking the medicine. Pregnancy  If you are about to stop having your period (premenopausal) and  you may become pregnant, seek counseling before you get pregnant.  Take 400 to 800 micrograms (mcg) of folic acid every day if you become pregnant.  Ask for birth control (contraception) if you want to prevent pregnancy. Osteoporosis and menopause Osteoporosis is a disease in which the bones lose minerals and strength with aging. This can result in bone fractures. If you are 65 years old or older, or if you are at risk for osteoporosis and fractures, ask your health care provider if you should:  Be screened for bone loss.  Take a calcium or vitamin D supplement to lower your risk of fractures.  Be given hormone replacement therapy (HRT) to treat symptoms of menopause. Follow these instructions at home: Lifestyle  Do not use any products that contain nicotine or tobacco, such as cigarettes, e-cigarettes, and chewing tobacco. If you need help quitting, ask your health care provider.  Do not use street drugs.  Do not share needles.  Ask your health care provider for help if you need support or information about quitting drugs. Alcohol use  Do not drink alcohol if: ? Your health care provider tells you not to drink. ? You are pregnant, may be pregnant, or are planning to become pregnant.  If you drink alcohol: ? Limit how much you use to 0-1 drink a day. ? Limit intake if you are breastfeeding.  Be aware of how much alcohol is in your drink. In the [place] one drink equals one 12 oz bottle of beer (355 mL), one 5 oz glass of wine (148 mL), or one 1 oz glass of hard liquor (44 mL). General instructions  Schedule regular health, dental, and eye exams.  Stay current with your vaccines.  Tell your health care provider if: ? You often feel depressed. ? You have ever been abused or do not feel safe at home. Summary  Adopting a healthy lifestyle and getting preventive care are important in promoting health and wellness.  Follow your health care provider's instructions about healthy  diet, exercising, and getting tested or screened for diseases.  Follow your health care provider's instructions on monitoring your cholesterol and blood pressure. This information is not intended to replace advice given to you by your health care provider. Make sure you discuss any questions you have with your health care provider. Document Revised: 01/04/2018 Document Reviewed: 01/04/2018 Elsevier Patient Education  2021 Elsevier Inc.  

## 2020-05-15 NOTE — Progress Notes (Signed)
I,Tianna Badgett,acting as a Education administrator for [employer], FNP.,have documented all relevant documentation on the behalf of Minette Brine, FNP,as directed by  Minette Brine, FNP while in the presence of Minette Brine, [place].  This visit occurred during the SARS-CoV-2 public health emergency.  Safety protocols were in place, including screening questions prior to the visit, additional usage of staff PPE, and extensive cleaning of exam room while observing appropriate contact time as indicated for disinfecting solutions.  Subjective:     Patient ID: Danielle Rogers , female    DOB: January 20, 1961 , 60 y.o.   MRN: [id number]   Chief Complaint  Patient presents with  . Annual Exam    HPI  Patient is here for physical exam. She reports compliance with medications. She has concerns with hand pain. She is eating a lot of salads with french dressing fat free.   Wt Readings from Last 3 Encounters: 05/15/20 : 234 lb 3.2 oz (106.2 kg) 05/14/19 : 235 lb 6.4 oz (106.8 kg) 11/23/18 : 238 lb 9.6 oz (108.2 kg)     Past Medical History:  Diagnosis Date  . Hypertension   . Obesity      History reviewed. No pertinent family history.   Current Outpatient Medications:  .  acetaminophen (TYLENOL) 500 MG tablet, Take 1 tablet (500 mg total) by mouth every 6 (six) hours as needed., Disp: 30 tablet, Rfl: 0 .  hydrochlorothiazide (HYDRODIURIL) 12.5 MG tablet, TAKE 1 TABLET(12.5 MG) BY MOUTH DAILY, Disp: 90 tablet, Rfl: 1 .  ibuprofen (ADVIL,MOTRIN) 800 MG tablet, Take 1 tablet (800 mg total) by mouth 3 (three) times daily., Disp: 21 tablet, Rfl: 0   No Known Allergies    The patient states she uses post menopausal status for birth control.  Patient's last menstrual period was 08/31/2011.. Negative for Dysmenorrhea and Negative for MenorrhagiaNegative for: breast discharge, breast lump(s), breast pain and breast self exam. Associated symptoms include abnormal vaginal bleeding. Pertinent negatives include abnormal  bleeding (hematology), anxiety, decreased libido, depression, difficulty falling sleep, dyspareunia, history of infertility, nocturia, sexual dysfunction, sleep disturbances, urinary incontinence, urinary urgency, vaginal discharge and vaginal itching. Diet regular; she is not eating bread and is eating salads. The patient states her exercise level is moderate with walking - approximately 1 mile. She is not doing any strength training. She is planning to go back to the gym.    The patient's tobacco use is:  Social History   Tobacco Use  Smoking Status Never Smoker  Smokeless Tobacco Never Used   She has been exposed to passive smoke. The patient's alcohol use is:  Social History   Substance and Sexual Activity  Alcohol Use No   Additional information: Last pap 2019, next one is due now but would like wait until the next time.    Review of Systems  Constitutional: Negative.   HENT: Negative.   Eyes: Negative.   Respiratory: Negative.   Cardiovascular: Negative.   Gastrointestinal: Negative.   Endocrine: Negative.   Genitourinary: Negative.   Musculoskeletal: Positive for arthralgias (right hand aching at night time).  Skin: Negative.   Allergic/Immunologic: Negative.   Neurological: Negative.   Hematological: Negative.   Psychiatric/Behavioral: Negative.      Today's Vitals   05/15/20 0843  BP: 128/80  Pulse: 78  Temp: 98.3 F (36.8 C)  TempSrc: Oral  Weight: 234 lb 3.2 oz (106.2 kg)  Height: 5' 3.8" (1.621 m)   Body mass index is 40.45 kg/m.   Objective:  Physical Exam  Constitutional:      Appearance: Normal appearance. She is well-developed.  HENT:     Head: Normocephalic and atraumatic.     Right Ear: Hearing and external ear normal. There is impacted cerumen (hard cerumen to ear canal). Tympanic membrane is bulging.     Left Ear: Hearing and external ear normal. There is impacted cerumen (hard cerumen to ear canal). Tympanic membrane is bulging.     Nose:  Nose normal.     Mouth/Throat:     Mouth: Mucous membranes are moist.  Eyes:     General: Lids are normal.     Conjunctiva/sclera: Conjunctivae normal.     Pupils: Pupils are equal, round, and reactive to light.     Funduscopic exam:    Right eye: No papilledema.        Left eye: No papilledema.  Neck:     Thyroid: No thyroid mass.     Vascular: No carotid bruit.  Cardiovascular:     Rate and Rhythm: Normal rate and regular rhythm.     Pulses: Normal pulses.     Heart sounds: Normal heart sounds. No murmur heard.   Pulmonary:     Effort: Pulmonary effort is normal. No respiratory distress.     Breath sounds: Normal breath sounds. No wheezing.  Abdominal:     General: Abdomen is flat. Bowel sounds are normal.     Palpations: Abdomen is soft.  Musculoskeletal:        General: No swelling. Normal range of motion.     Cervical back: Full passive range of motion without pain, normal range of motion and neck supple.     Right lower leg: No edema.     Left lower leg: No edema.  Skin:    General: Skin is warm and dry.     Capillary Refill: Capillary refill takes less than 2 seconds.  Neurological:     General: No focal deficit present.     Mental Status: She is alert and oriented to person, place, and time.     Cranial Nerves: No cranial nerve deficit.     Sensory: No sensory deficit.  Psychiatric:        Mood and Affect: Mood normal.        Behavior: Behavior normal.        Thought Content: Thought content normal.        Judgment: Judgment normal.         Assessment And Plan:     1. Health maintenance examination . Behavior modifications discussed and diet history reviewed.   . Pt will continue to exercise regularly and modify diet with low GI, plant based foods and decrease intake of processed foods.  . Recommend intake of daily multivitamin, Vitamin D, and calcium.  . Recommend mammogram and colonoscopy for preventive screenings, as well as recommend immunizations that  include influenza, TDAP - CBC - CMP14+EGFR - Lipid panel  2. Essential hypertension . B/P is controlled.  . CMP ordered to check renal function.  . The importance of regular exercise and dietary modification was stressed to the patient.  . Stressed importance of losing ten percent of her body weight to help with B/P control.  . The weight loss would help with decreasing cardiac and cancer risk as well.  . EKG done with NSR HR 80 - POCT Urinalysis Dipstick (81002) - POCT UA - Microalbumin - EKG 12-Lead  3. Left ear pain  She has cerumen impaction will do water lavage  4. Right hand pain  She is complaining of pain across her knuckles, this is likely arthritis  She can take tylenol as needed or apply voltaren gel  5. Class 3 severe obesity due to excess calories without serious comorbidity with body mass index (BMI) of 40.0 to 44.9 in adult [facility])  Chronic  Discussed healthy diet and regular exercise options   Encouraged to exercise at least 150 minutes per week with 2 days of strength training - Hemoglobin A1c  6. Impacted cerumen of left ear - Ear Lavage  7. Encounter for screening colonoscopy  According to USPTF Colorectal cancer Screening guidelines. Colonoscopy is recommended every 10 years, starting at age 30 years.  Will refer to GI for colon cancer screening. - Ambulatory referral to Gastroenterology  8. Encounter for screening mammogram for malignant neoplasm of breast  Pt instructed on Self Breast Exam.According to ACOG guidelines Women aged 63 and older are recommended to get an annual mammogram. Form completed and given to patient contact the [facility] for appointment scheduing.   Pt encouraged to get annual mammogram - MM Digital Screening; Future     Patient was given opportunity to ask questions. Patient verbalized understanding of the plan and was able to repeat key elements of the plan. All questions were answered to their satisfaction.    Minette Brine, FNP    I, Minette Brine, FNP, have reviewed all documentation for this visit. The documentation on 05/15/20 for the exam, diagnosis, procedures, and orders are all accurate and complete.  THE PATIENT IS ENCOURAGED TO PRACTICE SOCIAL DISTANCING DUE TO THE COVID-19 PANDEMIC.

## 2020-05-16 LAB — CBC
Hematocrit: 39.2 % (ref 34.0–46.6)
Hemoglobin: 13.5 g/dL (ref 11.1–15.9)
MCH: 30.7 pg (ref 26.6–33.0)
MCHC: 34.4 g/dL (ref 31.5–35.7)
MCV: 89 fL (ref 79–97)
Platelets: 227 10*3/uL (ref 150–450)
RBC: 4.4 x10E6/uL (ref 3.77–5.28)
RDW: 11.6 % — ABNORMAL LOW (ref 11.7–15.4)
WBC: 4 10*3/uL (ref 3.4–10.8)

## 2020-05-16 LAB — CMP14+EGFR
ALT: 12 IU/L (ref 0–32)
AST: 13 IU/L (ref 0–40)
Albumin/Globulin Ratio: 1.1 — ABNORMAL LOW (ref 1.2–2.2)
Albumin: 4.3 g/dL (ref 3.8–4.8)
Alkaline Phosphatase: 113 IU/L (ref 44–121)
BUN/Creatinine Ratio: 16 (ref 12–28)
BUN: 14 mg/dL (ref 8–27)
Bilirubin Total: 0.4 mg/dL (ref 0.0–1.2)
CO2: 24 mmol/L (ref 20–29)
Calcium: 9.9 mg/dL (ref 8.7–10.3)
Chloride: 98 mmol/L (ref 96–106)
Creatinine, Ser: 0.87 mg/dL (ref 0.57–1.00)
Globulin, Total: 3.8 g/dL (ref 1.5–4.5)
Glucose: 74 mg/dL (ref 65–99)
Potassium: 4.9 mmol/L (ref 3.5–5.2)
Sodium: 138 mmol/L (ref 134–144)
Total Protein: 8.1 g/dL (ref 6.0–8.5)
eGFR: 76 mL/min/{1.73_m2} (ref >59)

## 2020-05-16 LAB — HEMOGLOBIN A1C
Est. average glucose Bld gHb Est-mCnc: 108 mg/dL
Hgb A1c MFr Bld: 5.4 % (ref 4.8–5.6)

## 2020-05-16 LAB — LIPID PANEL
Chol/HDL Ratio: 2.5 ratio (ref 0.0–4.4)
Cholesterol, Total: 192 mg/dL (ref 100–199)
HDL: 78 mg/dL (ref >39)
LDL Chol Calc (NIH): 106 mg/dL — ABNORMAL HIGH (ref 0–99)
Triglycerides: 38 mg/dL (ref 0–149)
VLDL Cholesterol Cal: 8 mg/dL (ref 5–40)

## 2020-06-02 ENCOUNTER — Encounter: Payer: Self-pay | Admitting: Nurse Practitioner

## 2020-06-04 ENCOUNTER — Encounter: Payer: Self-pay | Admitting: Nurse Practitioner

## 2020-08-18 ENCOUNTER — Ambulatory Visit: Payer: PRIVATE HEALTH INSURANCE | Admitting: Nurse Practitioner

## 2021-05-20 ENCOUNTER — Encounter: Payer: PRIVATE HEALTH INSURANCE | Admitting: Nurse Practitioner

## 2024-01-23 ENCOUNTER — Encounter (HOSPITAL_COMMUNITY): Payer: Self-pay

## 2024-01-23 ENCOUNTER — Ambulatory Visit (HOSPITAL_COMMUNITY): Admission: EM | Admit: 2024-01-23 | Discharge: 2024-01-23 | Disposition: A | Discharge status: Home / Self Care

## 2024-01-23 DIAGNOSIS — J069 Acute upper respiratory infection, unspecified: Secondary | ICD-10-CM | POA: Diagnosis not present

## 2024-01-23 DIAGNOSIS — H6122 Impacted cerumen, left ear: Secondary | ICD-10-CM

## 2024-01-23 MED ORDER — AZELASTINE HCL 0.1 % NA SOLN: 1.0000 | Freq: Two times a day (BID) | NASAL | 1 refills | Status: AC

## 2024-01-23 MED ORDER — PROMETHAZINE-DM 6.25-15 MG/5ML PO SYRP: 10.0000 mL | ORAL_SOLUTION | Freq: Three times a day (TID) | ORAL | 0 refills | Status: AC | PRN

## 2024-01-23 NOTE — Discharge Instructions (Addendum)
" °  1. Viral upper respiratory tract infection (Primary) - azelastine (ASTELIN) 0.1 % nasal spray; Place 1 spray into both nostrils 2 (two) times daily. Use in each nostril as directed  Dispense: 30 mL; Refill: 1 - promethazine -dextromethorphan (PROMETHAZINE -DM) 6.25-15 MG/5ML syrup; Take 10 mLs by mouth 3 (three) times daily as needed for cough.  Dispense: 240 mL; Refill: 0 - Viral testing not completed in UC due to the onset of symptoms about 4 to 5 days ago and no current fever or worsening symptoms.  Course of treatment would not be altered due to positive testing.  2. Impacted cerumen of left ear - Ear wax removal completed in UC, post irrigation evaluation reveals no secondary infection, no significant erythema or bulging to tympanic membrane.  -Continue to monitor symptoms for any change in severity if there is any escalation of current symptoms or development of new symptoms follow-up in ER for further evaluation and management. "

## 2024-01-23 NOTE — ED Provider Notes (Signed)
 " [facility]  Note:  This document was prepared using Dragon voice recognition software and may include unintentional dictation errors.  MRN: [id number] DOB: 12-14-1960  Subjective:   Halyn Flaugher is a 63 y.o. female presenting for evaluation of productive cough with clear sputum, intermittent chills, body aches, dry mouth, nasal congestion and left ear pain x 4 days.  Patient also reports new onset of diarrhea since yesterday.  Has not taken any Imodium for diarrhea.  Patient denies taking any over-the-counter medication to treat symptoms prior to arrival in urgent care today.  Patient denies any known sick contacts.  No known fever, shortness of breath, chest pain, weakness, dizziness.  Current Medications[1]   Allergies[2]  Past Medical History:  Diagnosis Date   Hypertension    Obesity      Past Surgical History:  Procedure Laterality Date   TUBAL LIGATION      History reviewed. No pertinent family history.  Social History[3]  ROS Refer to HPI for ROS details.  Objective:    Vitals: BP 115/83 (BP Location: Left Arm)   Pulse 88   Temp 98.4 F (36.9 C) (Oral)   Resp 18   LMP 08/31/2011   SpO2 98%   Physical Exam Vitals and nursing note reviewed.  Constitutional:      General: She is not in acute distress.    Appearance: Normal appearance. She is well-developed. She is not ill-appearing or toxic-appearing.  HENT:     Head: Normocephalic and atraumatic.     Right Ear: Tympanic membrane, ear canal and external ear normal.     Left Ear: Tympanic membrane, ear canal and external ear normal. There is impacted cerumen.     Nose: Congestion present. No rhinorrhea.     Mouth/Throat:     Mouth: Mucous membranes are moist.     Pharynx: Oropharynx is clear.  Cardiovascular:     Rate and Rhythm: Normal rate.  Pulmonary:     Effort: Pulmonary effort is normal. No respiratory distress.     Breath sounds: No stridor. No wheezing.  Chest:     Chest  wall: No tenderness.  Skin:    General: Skin is warm and dry.  Neurological:     General: No focal deficit present.     Mental Status: She is alert and oriented to person, place, and time.  Psychiatric:        Mood and Affect: Mood normal.        Behavior: Behavior normal.     Procedures  No results found for this or any previous visit (from the past 24 hours).  Assessment and Plan :     Discharge Instructions       1. Viral upper respiratory tract infection (Primary) - azelastine (ASTELIN) 0.1 % nasal spray; Place 1 spray into both nostrils 2 (two) times daily. Use in each nostril as directed  Dispense: 30 mL; Refill: 1 - promethazine -dextromethorphan (PROMETHAZINE -DM) 6.25-15 MG/5ML syrup; Take 10 mLs by mouth 3 (three) times daily as needed for cough.  Dispense: 240 mL; Refill: 0 - Viral testing not completed in UC due to the onset of symptoms about 4 to 5 days ago and no current fever or worsening symptoms.  Course of treatment would not be altered due to positive testing.  2. Impacted cerumen of left ear - Ear wax removal completed in UC, post irrigation evaluation reveals no secondary infection, no significant erythema or bulging to tympanic membrane.  -Continue to monitor symptoms for any  change in severity if there is any escalation of current symptoms or development of new symptoms follow-up in ER for further evaluation and management.      Josceline Chenard B Ginnie Marich    [1] No current facility-administered medications for this encounter.  Current Outpatient Medications:    azelastine (ASTELIN) 0.1 % nasal spray, Place 1 spray into both nostrils 2 (two) times daily. Use in each nostril as directed, Disp: 30 mL, Rfl: 1   promethazine -dextromethorphan (PROMETHAZINE -DM) 6.25-15 MG/5ML syrup, Take 10 mLs by mouth 3 (three) times daily as needed for cough., Disp: 240 mL, Rfl: 0 [2] No Known Allergies [3]  Social History Tobacco Use   Smoking status: Never   Smokeless  tobacco: Never  Substance Use Topics   Alcohol use: No   Drug use: No     Aurea Ethel NOVAK, NP 01/23/24 0914  "

## 2024-01-23 NOTE — ED Triage Notes (Signed)
 Pt c/o productive cough with clear sputum, congestion, chills, body aches, dry mouth, and lt ear pain x4 days. States having diarrhea since yesterday. States took OTC meds with little relief.

## 2024-01-29 ENCOUNTER — Encounter (HOSPITAL_COMMUNITY): Payer: Self-pay

## 2024-01-29 ENCOUNTER — Other Ambulatory Visit: Payer: Self-pay

## 2024-01-29 ENCOUNTER — Emergency Department (HOSPITAL_COMMUNITY)

## 2024-01-29 ENCOUNTER — Emergency Department (HOSPITAL_COMMUNITY)
Admission: EM | Admit: 2024-01-29 | Discharge: 2024-01-29 | Disposition: A | Discharge status: Home / Self Care | Attending: Emergency Medicine | Admitting: Emergency Medicine

## 2024-01-29 DIAGNOSIS — R110 Nausea: Secondary | ICD-10-CM | POA: Insufficient documentation

## 2024-01-29 DIAGNOSIS — I10 Essential (primary) hypertension: Secondary | ICD-10-CM | POA: Insufficient documentation

## 2024-01-29 DIAGNOSIS — R197 Diarrhea, unspecified: Secondary | ICD-10-CM | POA: Diagnosis present

## 2024-01-29 DIAGNOSIS — R109 Unspecified abdominal pain: Secondary | ICD-10-CM | POA: Insufficient documentation

## 2024-01-29 LAB — CBC WITH DIFFERENTIAL/PLATELET
Abs Immature Granulocytes: 0.02 K/uL (ref 0.00–0.07)
Basophils Absolute: 0 K/uL (ref 0.0–0.1)
Basophils Relative: 0 %
Eosinophils Absolute: 0 K/uL (ref 0.0–0.5)
Eosinophils Relative: 0 %
HCT: 39.2 % (ref 36.0–46.0)
Hemoglobin: 12.7 g/dL (ref 12.0–15.0)
Immature Granulocytes: 0 %
Lymphocytes Relative: 35 %
Lymphs Abs: 1.8 K/uL (ref 0.7–4.0)
MCH: 30.8 pg (ref 26.0–34.0)
MCHC: 32.4 g/dL (ref 30.0–36.0)
MCV: 94.9 fL (ref 80.0–100.0)
Monocytes Absolute: 0.4 K/uL (ref 0.1–1.0)
Monocytes Relative: 8 %
Neutro Abs: 2.8 K/uL (ref 1.7–7.7)
Neutrophils Relative %: 57 %
Platelets: 236 K/uL (ref 150–400)
RBC: 4.13 MIL/uL (ref 3.87–5.11)
RDW: 12.2 % (ref 11.5–15.5)
WBC: 5.1 K/uL (ref 4.0–10.5)
nRBC: 0 % (ref 0.0–0.2)

## 2024-01-29 LAB — URINALYSIS, ROUTINE W REFLEX MICROSCOPIC
Bilirubin Urine: NEGATIVE
Glucose, UA: NEGATIVE mg/dL
Hgb urine dipstick: NEGATIVE
Ketones, ur: NEGATIVE mg/dL
Nitrite: NEGATIVE
Protein, ur: NEGATIVE mg/dL
Specific Gravity, Urine: 1.02 (ref 1.005–1.030)
pH: 5 (ref 5.0–8.0)

## 2024-01-29 LAB — COMPREHENSIVE METABOLIC PANEL WITH GFR
ALT: 8 U/L (ref 0–44)
AST: 17 U/L (ref 15–41)
Albumin: 3.8 g/dL (ref 3.5–5.0)
Alkaline Phosphatase: 76 U/L (ref 38–126)
Anion gap: 8 (ref 5–15)
BUN: 12 mg/dL (ref 8–23)
CO2: 26 mmol/L (ref 22–32)
Calcium: 9.3 mg/dL (ref 8.9–10.3)
Chloride: 104 mmol/L (ref 98–111)
Creatinine, Ser: 0.86 mg/dL (ref 0.44–1.00)
GFR, Estimated: >60 mL/min
Glucose, Bld: 103 mg/dL — ABNORMAL HIGH (ref 70–99)
Potassium: 4.1 mmol/L (ref 3.5–5.1)
Sodium: 138 mmol/L (ref 135–145)
Total Bilirubin: 0.4 mg/dL (ref 0.0–1.2)
Total Protein: 7.9 g/dL (ref 6.5–8.1)

## 2024-01-29 LAB — LIPASE, BLOOD: Lipase: 29 U/L (ref 11–51)

## 2024-01-29 LAB — MAGNESIUM: Magnesium: 2.1 mg/dL (ref 1.7–2.4)

## 2024-01-29 MED ORDER — LACTATED RINGERS IV BOLUS
1000.0000 mL | Freq: Once | INTRAVENOUS | Status: AC
  Administered 2024-01-29: 1000 mL via INTRAVENOUS

## 2024-01-29 MED ORDER — IOHEXOL 350 MG/ML SOLN: 75.0000 mL | Freq: Once | INTRAVENOUS | Status: DC | PRN

## 2024-01-29 MED ORDER — ONDANSETRON HCL 4 MG/2ML IJ SOLN
4.0000 mg | Freq: Once | INTRAMUSCULAR | Status: AC
  Administered 2024-01-29: 4 mg via INTRAVENOUS
  Filled 2024-01-29: qty 2

## 2024-01-29 MED ORDER — IOHEXOL 350 MG/ML SOLN
75.0000 mL | Freq: Once | INTRAVENOUS | Status: AC | PRN
  Administered 2024-01-29: 75 mL via INTRAVENOUS

## 2024-01-29 MED ORDER — ONDANSETRON 4 MG PO TBDP: 4.0000 mg | ORAL_TABLET | Freq: Three times a day (TID) | ORAL | 0 refills | Status: AC | PRN

## 2024-01-29 NOTE — ED Provider Notes (Signed)
 " [place] [facility] Provider Note   CSN: [id number] Arrival date & time: 01/29/24  1724     Patient presents with: Abdominal Pain, Nausea, and Diarrhea   Danielle Rogers is a 64 y.o. female.    Abdominal Pain Associated symptoms: diarrhea, fatigue and nausea   Diarrhea Associated symptoms: abdominal pain   Patient presents for multiple complaints.  Medical history includes HTN.  10 days ago, she had onset of URI symptoms.  She was seen in urgent care a week ago.  At the time, she endorsed chills, body aches, congestion, and left ear pain with new onset of diarrhea.  She was advised to treat with supportive care at home.  Patient reports improved congestion, however, she has had ongoing diarrhea for the past week.  She estimates 5 episodes per day of watery diarrhea.  It has been nonbloody.  She has had associated generalized abdominal cramping.  She tried some Imodium today with minimal relief.  She has not had vomiting but does get nauseous anytime she tries to eat.  She has been drinking fluids.  She has had dry mouth and lightheadedness.     Prior to Admission medications  Medication Sig Start Date End Date Taking? Authorizing Provider  ondansetron  (ZOFRAN -ODT) 4 MG disintegrating tablet Take 1 tablet (4 mg total) by mouth every 8 (eight) hours as needed. 01/29/24  Yes Melvenia Motto, MD  azelastine  (ASTELIN ) 0.1 % nasal spray Place 1 spray into both nostrils 2 (two) times daily. Use in each nostril as directed 01/23/24   Reddick, Johnathan B, NP  promethazine -dextromethorphan (PROMETHAZINE -DM) 6.25-15 MG/5ML syrup Take 10 mLs by mouth 3 (three) times daily as needed for cough. 01/23/24   Reddick, Johnathan B, NP    Allergies: Patient has no known allergies.    Review of Systems  Constitutional:  Positive for fatigue.  Gastrointestinal:  Positive for abdominal pain, diarrhea and nausea.  Neurological:  Positive for light-headedness.  All other  systems reviewed and are negative.   Updated Vital Signs BP (!) 148/66 (BP Location: Right Arm)   Pulse 86   Temp 97.9 F (36.6 C) (Oral)   Resp 18   Ht 5' 3 (1.6 m)   Wt 90.7 kg   LMP 08/31/2011   SpO2 100%   BMI 35.43 kg/m   Physical Exam Vitals and nursing note reviewed.  Constitutional:      General: She is not in acute distress.    Appearance: She is well-developed. She is not ill-appearing, toxic-appearing or diaphoretic.  HENT:     Head: Normocephalic and atraumatic.     Mouth/Throat:     Mouth: Mucous membranes are moist.  Eyes:     Conjunctiva/sclera: Conjunctivae normal.  Cardiovascular:     Rate and Rhythm: Normal rate and regular rhythm.  Pulmonary:     Effort: Pulmonary effort is normal. No respiratory distress.  Abdominal:     Palpations: Abdomen is soft.     Tenderness: There is no abdominal tenderness. There is no guarding or rebound.  Musculoskeletal:        General: No swelling.     Cervical back: Neck supple.  Skin:    General: Skin is warm and dry.     Coloration: Skin is not cyanotic, jaundiced or pale.  Neurological:     General: No focal deficit present.     Mental Status: She is alert and oriented to person, place, and time.  Psychiatric:  Mood and Affect: Mood normal.        Behavior: Behavior normal.     (all labs ordered are listed, but only abnormal results are displayed) Labs Reviewed  COMPREHENSIVE METABOLIC PANEL WITH GFR - Abnormal; Notable for the following components:      Result Value   Glucose, Bld 103 (*)    All other components within normal limits  URINALYSIS, ROUTINE W REFLEX MICROSCOPIC - Abnormal; Notable for the following components:   APPearance HAZY (*)    Leukocytes,Ua SMALL (*)    Bacteria, UA RARE (*)    All other components within normal limits  C DIFFICILE QUICK SCREEN W PCR REFLEX    GASTROINTESTINAL PANEL BY PCR, STOOL (REPLACES STOOL CULTURE)  CBC WITH DIFFERENTIAL/PLATELET  LIPASE, BLOOD   MAGNESIUM    EKG: None  Radiology: CT ABDOMEN PELVIS W CONTRAST Result Date: 01/29/2024 CLINICAL DATA:  Non localized abdomen pain EXAM: CT ABDOMEN AND PELVIS WITH CONTRAST TECHNIQUE: Multidetector CT imaging of the abdomen and pelvis was performed using the standard protocol following bolus administration of intravenous contrast. RADIATION DOSE REDUCTION: This exam was performed according to the departmental dose-optimization program which includes automated exposure control, adjustment of the mA and/or kV according to patient size and/or use of iterative reconstruction technique. CONTRAST:  75mL OMNIPAQUE  IOHEXOL  350 MG/ML SOLN COMPARISON:  None Available. FINDINGS: Lower chest: Lung bases demonstrate no acute airspace disease. Hepatobiliary: No calcified gallstone. No biliary dilatation. Subcentimeter hypodensity within the inferior right hepatic lobe, too small to further characterize. Pancreas: No ductal dilatation. Slightly limited assessment of pancreatic head and uncinate process due to presence of numerous enhancing vessels. Question slightly indistinct appearance of pancreatic tissue at the head and uncinate process. Spleen: Normal in size without focal abnormality. Adrenals/Urinary Tract: Adrenal glands are normal. Kidneys show no hydronephrosis. The bladder is normal Stomach/Bowel: Stomach within normal limits. No dilated small bowel. No acute bowel wall thickening. Negative appendix Vascular/Lymphatic: Mild atherosclerosis. No aneurysm. No suspicious lymph nodes. 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 Reproductive: Uterus and bilateral adnexa are unremarkable. Other: Negative for ascites or free air Musculoskeletal: No acute or suspicious osseous abnormality IMPRESSION: 1. 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 2. Slightly limited assessment of the pancreatic head and uncinate process due to presence of numerous enhancing vessels. Question slightly indistinct appearance of pancreatic tissue at the head and uncinate process, suggest correlation with pancreatic enzymes with possible MRI/MRCP follow-up. 3. Aortic atherosclerosis. Aortic Atherosclerosis (ICD10-I70.0). Electronically Signed   By: Luke Bun M.D.   On: 01/29/2024 20:41   DG Chest 2 View Result Date: 01/29/2024 EXAM: 2 VIEW(S) XRAY OF THE CHEST 01/29/2024 06:56:00 PM COMPARISON: Comparison with 10/29/2016. CLINICAL HISTORY: cough cough FINDINGS: LUNGS AND PLEURA: No focal pulmonary opacity. No pleural effusion. No pneumothorax. HEART AND MEDIASTINUM: Calcification of the aorta. BONES AND SOFT TISSUES: Degenerative changes in the spine. IMPRESSION: 1. No acute process. Electronically signed by: Elsie Gravely MD 01/29/2024 07:04 PM EST RP Workstation: [id number]     Procedures   Medications Ordered in the ED  iohexol  (OMNIPAQUE ) 350 MG/ML injection 75 mL (has no administration in time range)  lactated ringers  bolus 1,000 mL (1,000 mLs Intravenous New Bag/Given 01/29/24 1919)  ondansetron  (ZOFRAN ) injection 4 mg (4 mg Intravenous Given 01/29/24 2037)  iohexol  (OMNIPAQUE ) 350 MG/ML injection 75 mL (75 mLs  Intravenous Contrast Given 01/29/24 2020)                                    Medical Decision Making Amount and/or Complexity of Data Reviewed Labs: ordered. Radiology: ordered.  Risk Prescription drug management.   This patient presents to the ED for concern of diarrhea and abdominal discomfort, this involves an extensive number of treatment options, and is a complaint that carries with it a high risk of complications and morbidity.  The differential diagnosis includes viral enteritis, bacterial infection, parasitic etiology, dehydration, metabolic derangement   Co morbidities / Chronic conditions that  complicate the patient evaluation  HTN   Additional history obtained:  Additional history obtained from EMR External records from outside source obtained and reviewed including N/A   Lab Tests:  I Ordered, and personally interpreted labs.  The pertinent results include: Normal hemoglobin, no leukocytosis, normal kidney function, normal electrolytes, normal hepatobiliary enzymes, normal lipase, urinalysis not consistent with UTI   Imaging Studies ordered:  I ordered imaging studies including CT of abdomen and pelvis I independently visualized and interpreted imaging which showed incidental findings of dilated and tortuous enhancing vessels at porta hepatis; enhancing vessels at pancreatic head I agree with the radiologist interpretation   Cardiac Monitoring: / EKG:  The patient was maintained on a cardiac monitor.  I personally viewed and interpreted the cardiac monitored which showed an underlying rhythm of: Sinus rhythm   Problem List / ED Course / Critical interventions / Medication management  Patient presenting for ongoing diarrhea and abdominal cramping for the past week.  On arrival in the ED, vital signs are normal.  Patient is well-appearing on exam.  Her abdomen is soft.  She denies any tenderness.  She has been having sensation of dry mouth and lightheadedness with standing.  I suspect some dehydration given her decreased p.o. intake.  IV fluids were ordered.  Workup was initiated.  Patient's lab work was all reassuring.  She did have improved symptoms while in the ED.  She was able to eat and drink.  On CT scan, there were some incidental findings of dilated and tortuous vessels of the porta hepatis.  This is unlikely to be related to her diarrhea.  Patient was informed of this incidental finding.  She was advised to follow-up with gastroenterology.  Patient was discharged in stable condition. I ordered medication including IV fluid for hydration, Zofran  for  nausea Reevaluation of the patient after these medicines showed that the patient improved I have reviewed the patients home medicines and have made adjustments as needed  Social Determinants of Health:  Lives independently     Final diagnoses:  Diarrhea, unspecified type    ED Discharge Orders          Ordered    ondansetron  (ZOFRAN -ODT) 4 MG disintegrating tablet  Every 8 hours PRN        01/29/24 2104               Melvenia Motto, MD 01/29/24 2112  "

## 2024-01-29 NOTE — ED Provider Triage Note (Signed)
 Emergency Medicine Provider Triage Evaluation Note  Danielle Rogers , a 64 y.o. female  was evaluated in triage.  Pt complains of a cough, dry mouth, nonbloody diarrhea, generalized abdominal cramping that began about 10 days ago.  Reports she went to urgent care initially and was told she had a viral illness.  She reports she is not feeling any better.  Denies any chest pain or shortness of breath.  Denies any vomiting, but does report some nausea.  Review of Systems  Positive: As above Negative: As above  Physical Exam  BP 122/63 (BP Location: Right Arm)   Pulse 83   Temp 98.7 F (37.1 C) (Oral)   Resp 16   Ht 5' 3 (1.6 m)   Wt 90.7 kg   LMP 08/31/2011   SpO2 96%   BMI 35.43 kg/m  Gen:   Awake, no distress  Resp:  Normal effort  MSK:   Moves extremities without difficulty  Other:  Abdomen soft and nontender  Medical Decision Making  Medically screening exam initiated at 6:05 PM.  Appropriate orders placed.  Danielle Rogers was informed that the remainder of the evaluation will be completed by another provider, this initial triage assessment does not replace that evaluation, and the importance of remaining in the ED until their evaluation is complete.     Danielle Palma, PA-C 01/29/24 1805

## 2024-01-29 NOTE — ED Triage Notes (Signed)
 Pt c.o respiratory symptoms a week ago and then now it has progressed to abd pain, n/v/d since the Friday after christmas. Pt went to UC on 12/29 and was told she had a virus

## 2024-01-29 NOTE — ED Notes (Signed)
 Discharge instructions reviewed with patient. Patient questions answered and opportunity for education reviewed. Patient voices understanding of discharge instructions with no further questions. Patient ambulatory with steady gait to lobby.

## 2024-01-29 NOTE — Discharge Instructions (Addendum)
 Your CT scan showed the following: 1. 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 2. Slightly limited assessment of the pancreatic head and uncinate process due to presence of numerous enhancing vessels. Question slightly indistinct appearance of pancreatic tissue at the head and uncinate process, suggest correlation with pancreatic enzymes with possible MRI/MRCP follow-up.  This is unlikely related to your diarrhea or abdominal cramping.  It is something that you should follow-up with a gastroenterologist about.  Call the telephone number below to set up that follow-up appointment.  Your lab results were all quite normal.  Ensure that you are eating and drinking plenty of fluids at home to replace fluid losses from diarrhea.  A prescription for medication called ondansetron  was sent to your pharmacy.  Take this as needed for nausea.  Return to the emergency department for any new or worsening symptoms of concern.

## 2024-04-18 ENCOUNTER — Ambulatory Visit: Payer: Self-pay | Admitting: Nurse Practitioner
